# Patient Record
Sex: Female | Born: 1955 | Race: White | Hispanic: No | Marital: Married | Smoking: Never smoker
Health system: Southern US, Community
[De-identification: ages and names within clinical notes are randomized; demographics above are authoritative.]

## PROBLEM LIST (undated history)

## (undated) DIAGNOSIS — M8580 Other specified disorders of bone density and structure, unspecified site: Secondary | ICD-10-CM

## (undated) DIAGNOSIS — F4321 Adjustment disorder with depressed mood: Secondary | ICD-10-CM

## (undated) DIAGNOSIS — M810 Age-related osteoporosis without current pathological fracture: Secondary | ICD-10-CM

## (undated) DIAGNOSIS — N200 Calculus of kidney: Secondary | ICD-10-CM

## (undated) DIAGNOSIS — Z8679 Personal history of other diseases of the circulatory system: Secondary | ICD-10-CM

## (undated) HISTORY — DX: Other specified disorders of bone density and structure, unspecified site: M85.80

## (undated) HISTORY — DX: Personal history of other diseases of the circulatory system: Z86.79

## (undated) HISTORY — DX: Adjustment disorder with depressed mood: F43.21

## (undated) HISTORY — DX: Calculus of kidney: N20.0

## (undated) HISTORY — DX: Age-related osteoporosis without current pathological fracture: M81.0

---

## 1989-09-06 HISTORY — PX: TUBAL LIGATION: SHX77

## 1999-04-30 ENCOUNTER — Other Ambulatory Visit: Admission: RE | Admit: 1999-04-30 | Discharge: 1999-04-30 | Payer: Self-pay | Admitting: Obstetrics and Gynecology

## 2000-03-24 ENCOUNTER — Other Ambulatory Visit: Admission: RE | Admit: 2000-03-24 | Discharge: 2000-03-24 | Payer: Self-pay | Admitting: Obstetrics and Gynecology

## 2001-05-02 ENCOUNTER — Other Ambulatory Visit: Admission: RE | Admit: 2001-05-02 | Discharge: 2001-05-02 | Payer: Self-pay | Admitting: *Deleted

## 2002-05-03 ENCOUNTER — Other Ambulatory Visit: Admission: RE | Admit: 2002-05-03 | Discharge: 2002-05-03 | Payer: Self-pay | Admitting: Obstetrics and Gynecology

## 2003-05-07 ENCOUNTER — Other Ambulatory Visit: Admission: RE | Admit: 2003-05-07 | Discharge: 2003-05-07 | Payer: Self-pay | Admitting: Obstetrics and Gynecology

## 2003-05-14 ENCOUNTER — Encounter: Admission: RE | Admit: 2003-05-14 | Discharge: 2003-05-14 | Payer: Self-pay | Admitting: Obstetrics and Gynecology

## 2003-05-14 ENCOUNTER — Encounter: Payer: Self-pay | Admitting: Obstetrics and Gynecology

## 2004-05-07 ENCOUNTER — Other Ambulatory Visit: Admission: RE | Admit: 2004-05-07 | Discharge: 2004-05-07 | Payer: Self-pay | Admitting: Obstetrics and Gynecology

## 2004-07-07 ENCOUNTER — Encounter: Admission: RE | Admit: 2004-07-07 | Discharge: 2004-07-07 | Payer: Self-pay | Admitting: Obstetrics and Gynecology

## 2005-05-13 ENCOUNTER — Other Ambulatory Visit: Admission: RE | Admit: 2005-05-13 | Discharge: 2005-05-13 | Payer: Self-pay | Admitting: Obstetrics and Gynecology

## 2006-05-19 ENCOUNTER — Other Ambulatory Visit: Admission: RE | Admit: 2006-05-19 | Discharge: 2006-05-19 | Payer: Self-pay | Admitting: Obstetrics & Gynecology

## 2007-05-26 ENCOUNTER — Other Ambulatory Visit: Admission: RE | Admit: 2007-05-26 | Discharge: 2007-05-26 | Payer: Self-pay | Admitting: Obstetrics & Gynecology

## 2007-08-16 LAB — HM COLONOSCOPY: HM Colonoscopy: NORMAL

## 2008-05-31 ENCOUNTER — Other Ambulatory Visit: Admission: RE | Admit: 2008-05-31 | Discharge: 2008-05-31 | Payer: Self-pay | Admitting: Obstetrics and Gynecology

## 2010-07-03 ENCOUNTER — Encounter: Admission: RE | Admit: 2010-07-03 | Discharge: 2010-07-03 | Payer: Self-pay | Admitting: Obstetrics and Gynecology

## 2010-09-26 ENCOUNTER — Encounter: Payer: Self-pay | Admitting: Obstetrics and Gynecology

## 2013-07-11 ENCOUNTER — Encounter: Payer: Self-pay | Admitting: Obstetrics and Gynecology

## 2013-07-12 ENCOUNTER — Ambulatory Visit (INDEPENDENT_AMBULATORY_CARE_PROVIDER_SITE_OTHER): Payer: PRIVATE HEALTH INSURANCE | Admitting: Nurse Practitioner

## 2013-07-12 ENCOUNTER — Encounter: Payer: Self-pay | Admitting: Nurse Practitioner

## 2013-07-12 VITALS — BP 140/78 | HR 60 | Resp 16 | Ht 63.5 in | Wt 119.0 lb

## 2013-07-12 DIAGNOSIS — Z01419 Encounter for gynecological examination (general) (routine) without abnormal findings: Secondary | ICD-10-CM

## 2013-07-12 DIAGNOSIS — Z0000 Encounter for general adult medical examination without abnormal findings: Secondary | ICD-10-CM

## 2013-07-12 DIAGNOSIS — Z780 Asymptomatic menopausal state: Secondary | ICD-10-CM

## 2013-07-12 LAB — COMPREHENSIVE METABOLIC PANEL
ALT: 23 U/L (ref 0–35)
AST: 20 U/L (ref 0–37)
Albumin: 4.1 g/dL (ref 3.5–5.2)
Calcium: 9.5 mg/dL (ref 8.4–10.5)
Chloride: 100 mEq/L (ref 96–112)
Creat: 0.66 mg/dL (ref 0.50–1.10)
Glucose, Bld: 82 mg/dL (ref 70–99)
Sodium: 137 mEq/L (ref 135–145)
Total Bilirubin: 0.7 mg/dL (ref 0.3–1.2)
Total Protein: 6.6 g/dL (ref 6.0–8.3)

## 2013-07-12 LAB — POCT URINALYSIS DIPSTICK
Glucose, UA: NEGATIVE
Nitrite, UA: NEGATIVE
Protein, UA: NEGATIVE
Urobilinogen, UA: NEGATIVE
pH, UA: 5

## 2013-07-12 LAB — LIPID PANEL
Cholesterol: 221 mg/dL — ABNORMAL HIGH (ref 0–200)
Total CHOL/HDL Ratio: 2.9 Ratio

## 2013-07-12 LAB — TSH: TSH: 1.291 u[IU]/mL (ref 0.350–4.500)

## 2013-07-12 LAB — VITAMIN D 25 HYDROXY (VIT D DEFICIENCY, FRACTURES): Vit D, 25-Hydroxy: 54 ng/mL (ref 30–89)

## 2013-07-12 LAB — HEMOGLOBIN, FINGERSTICK: Hemoglobin, fingerstick: 14.2 g/dL (ref 12.0–16.0)

## 2013-07-12 MED ORDER — FLUOXETINE HCL 20 MG PO CAPS: 20.0000 mg | ORAL_CAPSULE | Freq: Every day | ORAL | Status: DC

## 2013-07-12 MED ORDER — FLUOXETINE HCL 10 MG PO CAPS: 10.0000 mg | ORAL_CAPSULE | Freq: Every day | ORAL | Status: DC

## 2013-07-12 NOTE — Progress Notes (Deleted)
[place], returned on 07/01/13

## 2013-07-12 NOTE — Patient Instructions (Signed)

## 2013-07-12 NOTE — Progress Notes (Signed)
Patient ID: April Spears, female   DOB: Nov 20, 1955, 57 y.o.   MRN: [id number] 57 y.o. G86P0101 Married Caucasian Fe here for annual exam.  No new health problems.  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  Since then she has lost 10 lbs.  She is doing well on this dose of Prozac and wants to stay at 30 mg.  Work is still stressful.  No LMP recorded. Patient is postmenopausal.          Sexually active: yes  The current method of family planning is post menopausal status.    Exercising: yes  Home exercise routine includes walking .5 hrs per day. Smoker:  no  Health Maintenance: Pap: 07/07/12, WNL, neg HR HPV MMG: 07/03/10, Bi-Rads 1: negative Colonoscopy: 08/16/07, normal, repeat in 10 years BMD: 07/2004 -0.9/-0.9 TDaP: 07/07/12 Labs: HB:  Urine: negative   reports that she has never smoked. She has never used smokeless tobacco. She reports that she drinks alcohol. She reports that she does not use illicit drugs.  Past Medical History  Diagnosis Date  . Renal calculi   . Situational depression     Past Surgical History  Procedure Laterality Date  . Tubal ligation Bilateral 1991    Current Outpatient Prescriptions  Medication Sig Dispense Refill  . FLUoxetine (PROZAC) 10 MG capsule Take 1 capsule (10 mg total) by mouth daily. Take in addition to 20 mg tab.  90 capsule  3  . FLUoxetine (PROZAC) 20 MG capsule Take 1 capsule (20 mg total) by mouth daily. Take in addition to 10 mg tab  90 capsule  3  . ValACYclovir HCl (VALTREX PO) Take by mouth as needed.       No current facility-administered medications for this visit.    Family History  Problem Relation Age of Onset  . Bone cancer Mother   . Thyroid disease Mother   . Diabetes Father   . Hyperlipidemia Father   . Hypertension Father   . Diabetes Sister     ROS:  Pertinent items are noted in HPI.  Otherwise, a comprehensive ROS was negative.  Exam:   BP 140/78  Pulse 60  Resp 16  Ht 5' 3.5"  (1.613 m)  Wt 119 lb (53.978 kg)  BMI 20.75 kg/m2 Height: 5' 3.5" (161.3 cm)  Ht Readings from Last 3 Encounters:  07/12/13 5' 3.5" (1.613 m)   Wt: 07/07/12+ 128.6  Weight loss with exercise and diet.   General appearance: alert, cooperative and appears stated age Head: Normocephalic, without obvious abnormality, atraumatic Neck: no adenopathy, supple, symmetrical, trachea midline and thyroid normal to inspection and palpation Lungs: clear to auscultation bilaterally Breasts: normal appearance, no masses or tenderness Heart: regular rate and rhythm Abdomen: soft, non-tender; no masses,  no organomegaly Extremities: extremities normal, atraumatic, no cyanosis or edema Skin: Skin color, texture, turgor normal. No rashes or lesions Lymph nodes: Cervical, supraclavicular, and axillary nodes normal. No abnormal inguinal nodes palpated Neurologic: Grossly normal   Pelvic: External genitalia:  no lesions              Urethra:  normal appearing urethra with no masses, tenderness or lesions              Bartholin's and Skene's: normal                 Vagina: normal appearing vagina with normal color and discharge, no lesions  Cervix: anteverted              Pap taken: no Bimanual Exam:  Uterus:  normal size, contour, position, consistency, mobility, non-tender              Adnexa: no mass, fullness, tenderness               Rectovaginal: Confirms               Anus:  normal sphincter tone, no lesions  A:  Well Woman with normal exam  Postmenopausal no HRT  Situational Depression.    P:   Pap smear as per guidelines   Mammogram due now and will schedule  [facility]  Refill on Prozac 30 mg  - 20 mg and 10 mg for a year.  Will follow with labs  IFOB today  Counseled on breast self exam, adequate intake of calcium and vitamin D, diet and exercise return annually or prn  An After Visit Summary was printed and given to the patient.

## 2013-07-18 NOTE — Progress Notes (Signed)
Encounter reviewed by Dr. Lourie Retz Silva.  

## 2013-07-23 ENCOUNTER — Telehealth: Payer: Self-pay | Admitting: *Deleted

## 2013-07-23 NOTE — Telephone Encounter (Signed)
I have attempted to contact this patient by phone with the following results: left message to return my call on answering machine (home).  

## 2013-07-23 NOTE — Telephone Encounter (Signed)
Message copied by Luisa Dago on Mon Jul 23, 2013  4:08 PM ------      Message from: Ria Comment R      Created: Fri Jul 13, 2013  5:11 PM       Let patient know about test results - her cholesterol is a little up. ------

## 2013-07-30 NOTE — Telephone Encounter (Signed)
Patient returning Stephanie's call.

## 2013-07-30 NOTE — Telephone Encounter (Signed)
Pt.notified

## 2014-07-08 ENCOUNTER — Encounter: Payer: Self-pay | Admitting: Nurse Practitioner

## 2014-07-15 ENCOUNTER — Ambulatory Visit (INDEPENDENT_AMBULATORY_CARE_PROVIDER_SITE_OTHER): Payer: PRIVATE HEALTH INSURANCE | Admitting: Nurse Practitioner

## 2014-07-15 ENCOUNTER — Other Ambulatory Visit: Payer: Self-pay | Admitting: Nurse Practitioner

## 2014-07-15 ENCOUNTER — Encounter: Payer: Self-pay | Admitting: Nurse Practitioner

## 2014-07-15 VITALS — BP 134/86 | HR 92 | Ht 63.75 in | Wt 120.0 lb

## 2014-07-15 DIAGNOSIS — Z0000 Encounter for general adult medical examination without abnormal findings: Secondary | ICD-10-CM

## 2014-07-15 DIAGNOSIS — Z1231 Encounter for screening mammogram for malignant neoplasm of breast: Secondary | ICD-10-CM

## 2014-07-15 DIAGNOSIS — Z833 Family history of diabetes mellitus: Secondary | ICD-10-CM

## 2014-07-15 DIAGNOSIS — I10 Essential (primary) hypertension: Secondary | ICD-10-CM

## 2014-07-15 DIAGNOSIS — Z01419 Encounter for gynecological examination (general) (routine) without abnormal findings: Secondary | ICD-10-CM

## 2014-07-15 LAB — TSH: TSH: 1.981 u[IU]/mL (ref 0.350–4.500)

## 2014-07-15 LAB — LIPID PANEL
CHOL/HDL RATIO: 2.7 ratio
CHOLESTEROL: 235 mg/dL — AB (ref 0–200)
HDL: 88 mg/dL (ref >39)
LDL Cholesterol: 117 mg/dL — ABNORMAL HIGH (ref 0–99)
Triglycerides: 148 mg/dL (ref <150)
VLDL: 30 mg/dL (ref 0–40)

## 2014-07-15 LAB — COMPREHENSIVE METABOLIC PANEL
ALT: 19 U/L (ref 0–35)
AST: 25 U/L (ref 0–37)
Albumin: 4.2 g/dL (ref 3.5–5.2)
Alkaline Phosphatase: 53 U/L (ref 39–117)
BUN: 10 mg/dL (ref 6–23)
CO2: 27 mEq/L (ref 19–32)
CREATININE: 0.7 mg/dL (ref 0.50–1.10)
Calcium: 9.1 mg/dL (ref 8.4–10.5)
Chloride: 102 mEq/L (ref 96–112)
Glucose, Bld: 129 mg/dL — ABNORMAL HIGH (ref 70–99)
Potassium: 4.4 mEq/L (ref 3.5–5.3)
Sodium: 139 mEq/L (ref 135–145)
Total Bilirubin: 0.6 mg/dL (ref 0.2–1.2)
Total Protein: 6.3 g/dL (ref 6.0–8.3)

## 2014-07-15 LAB — HEMOGLOBIN, FINGERSTICK: HEMOGLOBIN, FINGERSTICK: 14.3 g/dL (ref 12.0–16.0)

## 2014-07-15 MED ORDER — VALACYCLOVIR HCL 1 G PO TABS: 1000.0000 mg | ORAL_TABLET | Freq: Two times a day (BID) | ORAL | Status: DC

## 2014-07-15 MED ORDER — FLUOXETINE HCL 20 MG PO CAPS: 20.0000 mg | ORAL_CAPSULE | Freq: Every day | ORAL | Status: DC

## 2014-07-15 MED ORDER — FLUOXETINE HCL 10 MG PO CAPS: 10.0000 mg | ORAL_CAPSULE | Freq: Every day | ORAL | Status: DC

## 2014-07-15 NOTE — Progress Notes (Signed)
58 y.o. 731P0101 Married Caucasian Fe here for annual exam.  No new health problems.  Patient's last menstrual period was 05/09/2006 (exact date).          Sexually active: yes  The current method of family planning is post menopausal status.  Exercising: yes Home exercise routine includes walking 30 minutes every day. Smoker: no  Health Maintenance: Pap: 07/07/12, WNL, neg HR HPV MMG: 07/03/10, Bi-Rads 1: negative Colonoscopy: 08/16/07, normal, repeat in 10 years BMD: 07/2004 -0.9/-0.9 TDaP: 07/07/12 Flu Shot:  06/25/14 Labs:  HB:  14.3  Urine:  Unable to void   reports that she has never smoked. She has never used smokeless tobacco. She reports that she drinks alcohol. She reports that she does not use illicit drugs.  Past Medical History  Diagnosis Date  . Renal calculi   . Situational depression     Past Surgical History  Procedure Laterality Date  . Tubal ligation Bilateral 1991    Current Outpatient Prescriptions  Medication Sig Dispense Refill  . FLUoxetine (PROZAC) 10 MG capsule Take 1 capsule (10 mg total) by mouth daily. Take in addition to 20 mg tab. 90 capsule 3  . FLUoxetine (PROZAC) 20 MG capsule Take 1 capsule (20 mg total) by mouth daily. Take in addition to 10 mg tab 90 capsule 3  . ValACYclovir HCl (VALTREX PO) Take by mouth as needed.     No current facility-administered medications for this visit.    Family History  Problem Relation Age of Onset  . Bone cancer Mother   . Thyroid disease Mother   . Diabetes Father   . Hyperlipidemia Father   . Hypertension Father   . Diabetes Sister     ROS:  Pertinent items are noted in HPI.  Otherwise, a comprehensive ROS was negative.  Exam:   BP 134/86 mmHg  Pulse 92  Ht 5' 3.75" (1.619 m)  Wt 120 lb (54.432 kg)  BMI 20.77 kg/m2  LMP 05/09/2006 (Exact Date) Height: 5' 3.75" (161.9 cm)  Ht Readings from Last 3 Encounters:  07/15/14 5' 3.75" (1.619 m)  07/12/13 5' 3.5" (1.613 m)    General  appearance: alert, cooperative and appears stated age Head: Normocephalic, without obvious abnormality, atraumatic Neck: no adenopathy, supple, symmetrical, trachea midline and thyroid normal to inspection and palpation Lungs: clear to auscultation bilaterally Breasts: normal appearance, no masses or tenderness Heart: regular rate and rhythm Abdomen: soft, non-tender; no masses,  no organomegaly Extremities: extremities normal, atraumatic, no cyanosis or edema Skin: Skin color, texture, turgor normal. No rashes or lesions Lymph nodes: Cervical, supraclavicular, and axillary nodes normal. No abnormal inguinal nodes palpated Neurologic: Grossly normal   Pelvic: External genitalia:  no lesions              Urethra:  normal appearing urethra with no masses, tenderness or lesions              Bartholin's and Skene's: normal                 Vagina: normal appearing vagina with normal color and discharge, no lesions              Cervix: anteverted              Pap taken: No. Bimanual Exam:  Uterus:  normal size, contour, position, consistency, mobility, non-tender              Adnexa: no mass, fullness, tenderness  Rectovaginal: Confirms               Anus:  normal sphincter tone, no lesions  A:  Well Woman with normal exam  Postmenopausal no HRT Situational Depression.  History of HSV I  P:   Reviewed health and wellness pertinent to exam  Pap smear not taken today  Mammogram is past due and will schedule  Refill Prozac 30 mg daily ( 20 mg and 10 mg daily) for a year  Refill Valtrex for a year  Will follow with labs  Counseled on breast self exam, mammography screening, adequate intake of calcium and vitamin D, diet and exercise return annually or prn  An After Visit Summary was printed and given to the patient.

## 2014-07-15 NOTE — Patient Instructions (Signed)

## 2014-07-16 LAB — VITAMIN D 25 HYDROXY (VIT D DEFICIENCY, FRACTURES): Vit D, 25-Hydroxy: 57 ng/mL (ref 30–89)

## 2014-07-18 LAB — HEMOGLOBIN A1C
HEMOGLOBIN A1C: 5.1 % (ref <5.7)
Mean Plasma Glucose: 100 mg/dL (ref <117)

## 2014-07-18 NOTE — Progress Notes (Signed)
Encounter reviewed by Dr. Brook Silva.  

## 2014-07-23 ENCOUNTER — Ambulatory Visit
Admission: RE | Admit: 2014-07-23 | Discharge: 2014-07-23 | Disposition: A | Discharge status: Home / Self Care | Payer: PRIVATE HEALTH INSURANCE | Source: Ambulatory Visit | Attending: Nurse Practitioner | Admitting: Nurse Practitioner

## 2014-07-23 DIAGNOSIS — Z1231 Encounter for screening mammogram for malignant neoplasm of breast: Secondary | ICD-10-CM

## 2014-07-24 ENCOUNTER — Telehealth: Payer: Self-pay | Admitting: *Deleted

## 2014-07-24 NOTE — Telephone Encounter (Signed)
I have attempted to contact this patient by phone with the following results: left message to return call to April Spears at [phone] on answering machine (mobile per [facility]).  No personal information given.  [phone] (Home/Mobile)  Notes Recorded by April FranklinPatricia Rolen-Grubb, FNP Let patient know that Lipid panel still shows elevated total cholesterol and LDL. Both of these are higher than last year. Must work on low cholesterol diet. The thyroid, Vit D, kidney, and liver test are normal. The glucose was elevated and will get HGB AIC and let her know those results later.

## 2014-07-24 NOTE — Telephone Encounter (Signed)
-----   Message from Lauro FranklinPatricia Rolen-Grubb, FNP sent at 07/16/2014  8:24 AM EST ----- Please add HGB AIC

## 2014-07-26 NOTE — Telephone Encounter (Signed)
Pt notified in result note.  Closing encounter. 

## 2014-12-19 ENCOUNTER — Telehealth: Payer: Self-pay | Admitting: Nurse Practitioner

## 2014-12-19 NOTE — Telephone Encounter (Signed)
Left patient a message to call back to reschedule a future appointment that was cancelled by the provider. °

## 2015-07-17 ENCOUNTER — Ambulatory Visit: Payer: PRIVATE HEALTH INSURANCE | Admitting: Nurse Practitioner

## 2015-07-23 ENCOUNTER — Ambulatory Visit (INDEPENDENT_AMBULATORY_CARE_PROVIDER_SITE_OTHER): Payer: PRIVATE HEALTH INSURANCE | Admitting: Nurse Practitioner

## 2015-07-23 ENCOUNTER — Encounter: Payer: Self-pay | Admitting: Nurse Practitioner

## 2015-07-23 VITALS — BP 136/88 | HR 72 | Ht 63.5 in | Wt 122.0 lb

## 2015-07-23 DIAGNOSIS — Z87448 Personal history of other diseases of urinary system: Secondary | ICD-10-CM | POA: Diagnosis not present

## 2015-07-23 DIAGNOSIS — Z0000 Encounter for general adult medical examination without abnormal findings: Secondary | ICD-10-CM | POA: Diagnosis not present

## 2015-07-23 DIAGNOSIS — Z01419 Encounter for gynecological examination (general) (routine) without abnormal findings: Secondary | ICD-10-CM

## 2015-07-23 DIAGNOSIS — Z1211 Encounter for screening for malignant neoplasm of colon: Secondary | ICD-10-CM | POA: Diagnosis not present

## 2015-07-23 DIAGNOSIS — Z87898 Personal history of other specified conditions: Secondary | ICD-10-CM

## 2015-07-23 LAB — POCT URINALYSIS DIPSTICK
Bilirubin, UA: NEGATIVE
Blood, UA: NEGATIVE
GLUCOSE UA: NEGATIVE
Ketones, UA: NEGATIVE
Nitrite, UA: NEGATIVE
Protein, UA: NEGATIVE
UROBILINOGEN UA: NEGATIVE
pH, UA: 7

## 2015-07-23 LAB — LIPID PANEL
CHOLESTEROL: 200 mg/dL (ref 125–200)
HDL: 80 mg/dL (ref >=46)
LDL Cholesterol: 109 mg/dL (ref <130)
Total CHOL/HDL Ratio: 2.5 Ratio (ref <=5.0)
Triglycerides: 56 mg/dL (ref <150)
VLDL: 11 mg/dL (ref <30)

## 2015-07-23 LAB — COMPREHENSIVE METABOLIC PANEL
ALBUMIN: 4 g/dL (ref 3.6–5.1)
ALT: 14 U/L (ref 6–29)
AST: 17 U/L (ref 10–35)
Alkaline Phosphatase: 54 U/L (ref 33–130)
BUN: 9 mg/dL (ref 7–25)
CHLORIDE: 101 mmol/L (ref 98–110)
CO2: 28 mmol/L (ref 20–31)
Calcium: 9.2 mg/dL (ref 8.6–10.4)
Creat: 0.56 mg/dL (ref 0.50–1.05)
Glucose, Bld: 81 mg/dL (ref 65–99)
POTASSIUM: 4.6 mmol/L (ref 3.5–5.3)
Sodium: 137 mmol/L (ref 135–146)
TOTAL PROTEIN: 6.3 g/dL (ref 6.1–8.1)
Total Bilirubin: 0.5 mg/dL (ref 0.2–1.2)

## 2015-07-23 LAB — CBC WITH DIFFERENTIAL/PLATELET
Basophils Absolute: 0 10*3/uL (ref 0.0–0.1)
Basophils Relative: 1 % (ref 0–1)
Eosinophils Absolute: 0.1 10*3/uL (ref 0.0–0.7)
Eosinophils Relative: 2 % (ref 0–5)
HCT: 42.3 % (ref 36.0–46.0)
Hemoglobin: 13.8 g/dL (ref 12.0–15.0)
Lymphocytes Relative: 22 % (ref 12–46)
Lymphs Abs: 1 10*3/uL (ref 0.7–4.0)
MCH: 30.3 pg (ref 26.0–34.0)
MCHC: 32.6 g/dL (ref 30.0–36.0)
MCV: 92.8 fL (ref 78.0–100.0)
MPV: 10.6 fL (ref 8.6–12.4)
Monocytes Absolute: 0.4 10*3/uL (ref 0.1–1.0)
Monocytes Relative: 10 % (ref 3–12)
NEUTROS ABS: 2.9 10*3/uL (ref 1.7–7.7)
Neutrophils Relative %: 65 % (ref 43–77)
Platelets: 255 10*3/uL (ref 150–400)
RBC: 4.56 MIL/uL (ref 3.87–5.11)
RDW: 13 % (ref 11.5–15.5)
WBC: 4.4 10*3/uL (ref 4.0–10.5)

## 2015-07-23 LAB — URINALYSIS, MICROSCOPIC ONLY
Bacteria, UA: NONE SEEN [HPF]
CRYSTALS: NONE SEEN [HPF]
Casts: NONE SEEN [LPF]
RBC / HPF: NONE SEEN RBC/HPF (ref <=2)
WBC UA: NONE SEEN WBC/HPF (ref <=5)
Yeast: NONE SEEN [HPF]

## 2015-07-23 LAB — TSH: TSH: 0.986 u[IU]/mL (ref 0.350–4.500)

## 2015-07-23 MED ORDER — FLUOXETINE HCL 20 MG PO CAPS: 20.0000 mg | ORAL_CAPSULE | Freq: Every day | ORAL | Status: DC

## 2015-07-23 MED ORDER — FLUOXETINE HCL 10 MG PO CAPS: 10.0000 mg | ORAL_CAPSULE | Freq: Every day | ORAL | Status: DC

## 2015-07-23 MED ORDER — VALACYCLOVIR HCL 1 G PO TABS: 1000.0000 mg | ORAL_TABLET | Freq: Two times a day (BID) | ORAL | Status: DC

## 2015-07-23 NOTE — Patient Instructions (Signed)

## 2015-07-23 NOTE — Progress Notes (Signed)
Patient ID: April Spears, female   DOB: [birth year], 59 y.o.   MRN: [id number] 59 y.o. G2P0101 Married  Caucasian Fe here for annual exam.  No new health diagnosis.  Some vaginal dryness.  Most recently urinary urgency.  Patient's last menstrual period was 05/09/2006 (exact date).          Sexually active: Yes.    The current method of family planning is tubal ligation and post menopausal status.    Exercising: Yes.    walking Smoker:  no  Health Maintenance: Pap: 07/07/12, Negative with neg HR HPV MMG: 07/23/14, Bi-Rads 1: negative Colonoscopy: 08/16/07, normal, repeat in 10 years BMD: 07/2004 -0.9/-0.9 TDaP: 07/07/12 Labs: labs done fasting  Urine: trace leuk's   reports that she has never smoked. She has never used smokeless tobacco. She reports that she drinks alcohol. She reports that she does not use illicit drugs.  Past Medical History  Diagnosis Date  . Renal calculi   . Situational depression     Past Surgical History  Procedure Laterality Date  . Tubal ligation Bilateral 1991    Current Outpatient Prescriptions  Medication Sig Dispense Refill  . FLUoxetine (PROZAC) 10 MG capsule Take 1 capsule (10 mg total) by mouth daily. Take in addition to 20 mg tab. 90 capsule 3  . FLUoxetine (PROZAC) 20 MG capsule Take 1 capsule (20 mg total) by mouth daily. Take in addition to 10 mg tab 90 capsule 3  . valACYclovir (VALTREX) 1000 MG tablet Take 1 tablet (1,000 mg total) by mouth 2 (two) times daily. 90 tablet 3   No current facility-administered medications for this visit.    Family History  Problem Relation Age of Onset  . Bone cancer Mother   . Thyroid disease Mother   . Diabetes Father   . Hyperlipidemia Father   . Hypertension Father   . Diabetes Sister     ROS:  Pertinent items are noted in HPI.  Otherwise, a comprehensive ROS was negative.  Exam:   BP 142/84 mmHg  Pulse 72  Ht 5' 3.5" (1.613 m)  Wt 122 lb (55.339 kg)  BMI 21.27 kg/m2  LMP 05/09/2006 (Exact  Date) Height: 5' 3.5" (161.3 cm) Ht Readings from Last 3 Encounters:  07/23/15 5' 3.5" (1.613 m)  07/15/14 5' 3.75" (1.619 m)  07/12/13 5' 3.5" (1.613 m)    General appearance: alert, cooperative and appears stated age Head: Normocephalic, without obvious abnormality, atraumatic Neck: no adenopathy, supple, symmetrical, trachea midline and thyroid normal to inspection and palpation Lungs: clear to auscultation bilaterally Breasts: normal appearance, no masses or tenderness Heart: regular rate and rhythm Abdomen: soft, non-tender; no masses,  no organomegaly Extremities: extremities normal, atraumatic, no cyanosis or edema Skin: Skin color, texture, turgor normal. No rashes or lesions Lymph nodes: Cervical, supraclavicular, and axillary nodes normal. No abnormal inguinal nodes palpated Neurologic: Grossly normal   Pelvic: External genitalia:  no lesions              Urethra:  normal appearing urethra with no masses, tenderness or lesions              Bartholin's and Skene's: normal                 Vagina: normal appearing vagina with normal color and discharge, no lesions              Cervix: anteverted              Pap taken: Yes.  Bimanual Exam:  Uterus:  normal size, contour, position, consistency, mobility, non-tender              Adnexa: no mass, fullness, tenderness               Rectovaginal: Confirms               Anus:  normal sphincter tone, no lesions  Chaperone present: yes  A:  Well Woman with normal exam  Postmenopausal no HRT Situational Depression. History of HSV I  P:   Reviewed health and wellness pertinent to exam  Pap smear as above  Mammogram is due now and will schedule  Refill on Prozac 10 & 20 mg for a total of 30 mg for a year  Refill on Valtrex to use prn for HSV I  Will follow with labs, IFOB  Counseled on breast self exam, mammography screening, adequate intake of calcium and vitamin D, diet and exercise return annually  or prn  An After Visit Summary was printed and given to the patient.

## 2015-07-24 LAB — URINE CULTURE
Colony Count: NO GROWTH
Organism ID, Bacteria: NO GROWTH

## 2015-07-24 LAB — HIV ANTIBODY (ROUTINE TESTING W REFLEX): HIV 1&2 Ab, 4th Generation: NONREACTIVE

## 2015-07-24 LAB — VITAMIN D 25 HYDROXY (VIT D DEFICIENCY, FRACTURES): VIT D 25 HYDROXY: 47 ng/mL (ref 30–100)

## 2015-07-24 LAB — HEPATITIS C ANTIBODY: HCV Ab: NEGATIVE

## 2015-07-27 NOTE — Progress Notes (Signed)
Encounter reviewed by Dr. Saysha Menta Amundson C. Silva.  

## 2015-07-28 LAB — IPS PAP TEST WITH HPV

## 2016-07-28 ENCOUNTER — Encounter: Payer: Self-pay | Admitting: Nurse Practitioner

## 2016-07-28 ENCOUNTER — Ambulatory Visit (INDEPENDENT_AMBULATORY_CARE_PROVIDER_SITE_OTHER): Payer: PRIVATE HEALTH INSURANCE | Admitting: Nurse Practitioner

## 2016-07-28 VITALS — BP 134/82 | HR 64 | Ht 63.25 in | Wt 120.0 lb

## 2016-07-28 DIAGNOSIS — Z0000 Encounter for general adult medical examination without abnormal findings: Secondary | ICD-10-CM

## 2016-07-28 DIAGNOSIS — Z01419 Encounter for gynecological examination (general) (routine) without abnormal findings: Secondary | ICD-10-CM | POA: Diagnosis not present

## 2016-07-28 DIAGNOSIS — R8290 Unspecified abnormal findings in urine: Secondary | ICD-10-CM | POA: Diagnosis not present

## 2016-07-28 DIAGNOSIS — Z1211 Encounter for screening for malignant neoplasm of colon: Secondary | ICD-10-CM | POA: Diagnosis not present

## 2016-07-28 LAB — CBC
HCT: 44 % (ref 35.0–45.0)
HEMOGLOBIN: 13.9 g/dL (ref 11.7–15.5)
MCH: 30.3 pg (ref 27.0–33.0)
MCHC: 31.6 g/dL — ABNORMAL LOW (ref 32.0–36.0)
MCV: 96.1 fL (ref 80.0–100.0)
MPV: 10.2 fL (ref 7.5–12.5)
PLATELETS: 204 10*3/uL (ref 140–400)
RBC: 4.58 MIL/uL (ref 3.80–5.10)
RDW: 13 % (ref 11.0–15.0)
WBC: 3.5 10*3/uL — AB (ref 3.8–10.8)

## 2016-07-28 LAB — POCT URINALYSIS DIPSTICK
Bilirubin, UA: NEGATIVE
Blood, UA: NEGATIVE
Glucose, UA: NEGATIVE
KETONES UA: NEGATIVE
Nitrite, UA: NEGATIVE
PROTEIN UA: NEGATIVE
Urobilinogen, UA: NEGATIVE
pH, UA: 7

## 2016-07-28 LAB — TSH: TSH: 1.21 m[IU]/L

## 2016-07-28 MED ORDER — FLUOXETINE HCL 10 MG PO CAPS: 10.0000 mg | ORAL_CAPSULE | Freq: Every day | ORAL | 4 refills | Status: DC

## 2016-07-28 MED ORDER — VALACYCLOVIR HCL 1 G PO TABS: 1000.0000 mg | ORAL_TABLET | Freq: Two times a day (BID) | ORAL | 3 refills | Status: DC

## 2016-07-28 MED ORDER — FLUOXETINE HCL 20 MG PO CAPS: 20.0000 mg | ORAL_CAPSULE | Freq: Every day | ORAL | 4 refills | Status: DC

## 2016-07-28 NOTE — Patient Instructions (Signed)

## 2016-07-28 NOTE — Progress Notes (Signed)
Patient ID: Baird LyonsJoy D Fiebig, female   DOB: 08-13-1956, 60 y.o.   MRN: [id number]  60 y.o. 171P0101 Married  Caucasian Fe here for annual exam.  No new diagnosis this year.  Doing well with anxiety on current dose of Prozac.  She was treated for UTI in October and now no symptoms.  Patient's last menstrual period was 05/09/2006 (exact date).          Sexually active: Yes.    The current method of family planning is tubal ligation.    Exercising: Yes.    walking Smoker:  no  Health Maintenance: Pap: 07/23/15, Negative with neg HR HPV MMG: 07/23/14, Bi-Rads 1: negative Colonoscopy: 08/16/07, normal, repeat in 10 years, IFOB is given BMD: 07/09/04 T Score: -0.9 Spine / -0.9 Left Femur Neck TDaP: 07/07/12 Shingles: will check with pharmacy Hep C and HIV: 07/23/15 Labs: HB: 13.6  Urine: 1+ WBC   reports that she has never smoked. She has never used smokeless tobacco. She reports that she drinks alcohol. She reports that she does not use drugs.  Past Medical History:  Diagnosis Date  . Renal calculi   . Situational depression     Past Surgical History:  Procedure Laterality Date  . TUBAL LIGATION Bilateral 1991    Current Outpatient Prescriptions  Medication Sig Dispense Refill  . FLUoxetine (PROZAC) 10 MG capsule Take 1 capsule (10 mg total) by mouth daily. Take in addition to 20 mg tab. 90 capsule 4  . FLUoxetine (PROZAC) 20 MG capsule Take 1 capsule (20 mg total) by mouth daily. Take in addition to 10 mg tab 90 capsule 4  . valACYclovir (VALTREX) 1000 MG tablet Take 1 tablet (1,000 mg total) by mouth 2 (two) times daily. 90 tablet 3   No current facility-administered medications for this visit.     Family History  Problem Relation Age of Onset  . Bone cancer Mother   . Thyroid disease Mother   . Diabetes Father   . Hyperlipidemia Father   . Hypertension Father   . Diabetes Sister     ROS:  Pertinent items are noted in HPI.  Otherwise, a comprehensive ROS was negative.  Exam:    LMP 05/09/2006 (Exact Date)    Ht Readings from Last 3 Encounters:  07/23/15 5' 3.5" (1.613 m)  07/15/14 5' 3.75" (1.619 m)  07/12/13 5' 3.5" (1.613 m)    General appearance: alert, cooperative and appears stated age Head: Normocephalic, without obvious abnormality, atraumatic Neck: no adenopathy, supple, symmetrical, trachea midline and thyroid normal to inspection and palpation Lungs: clear to auscultation bilaterally Breasts: normal appearance, no masses or tenderness Heart: regular rate and rhythm Abdomen: soft, non-tender; no masses,  no organomegaly Extremities: extremities normal, atraumatic, no cyanosis or edema Skin: Skin color, texture, turgor normal. No rashes or lesions Lymph nodes: Cervical, supraclavicular, and axillary nodes normal. No abnormal inguinal nodes palpated Neurologic: Grossly normal   Pelvic: External genitalia:  no lesions              Urethra:  normal appearing urethra with no masses, tenderness or lesions              Bartholin's and Skene's: normal                 Vagina: normal appearing vagina with normal color and discharge, no lesions              Cervix: anteverted  Pap taken: No. Bimanual Exam:  Uterus:  normal size, contour, position, consistency, mobility, non-tender              Adnexa: no mass, fullness, tenderness               Rectovaginal: Confirms               Anus:  normal sphincter tone, no lesions  Chaperone present: yes  A:  Well Woman with normal exam   Postmenopausal no HRT Situational Depression. History of HSV I   P:   Reviewed health and wellness pertinent to exam  Pap smear as above  Mammogram is due now and will schedule  IFOB is given  Counseled on breast self exam, mammography screening, adequate intake of calcium and vitamin D, diet and exercise, Kegel's exercises return annually or prn  An After Visit Summary was printed and given to the patient.

## 2016-07-29 LAB — COMPREHENSIVE METABOLIC PANEL
ALBUMIN: 4.1 g/dL (ref 3.6–5.1)
ALT: 16 U/L (ref 6–29)
AST: 21 U/L (ref 10–35)
Alkaline Phosphatase: 48 U/L (ref 33–130)
BUN: 11 mg/dL (ref 7–25)
CALCIUM: 9 mg/dL (ref 8.6–10.4)
CO2: 27 mmol/L (ref 20–31)
Chloride: 103 mmol/L (ref 98–110)
Creat: 0.66 mg/dL (ref 0.50–0.99)
Glucose, Bld: 80 mg/dL (ref 65–99)
POTASSIUM: 4.8 mmol/L (ref 3.5–5.3)
Sodium: 140 mmol/L (ref 135–146)
Total Bilirubin: 0.5 mg/dL (ref 0.2–1.2)
Total Protein: 6.5 g/dL (ref 6.1–8.1)

## 2016-07-29 LAB — VITAMIN D 25 HYDROXY (VIT D DEFICIENCY, FRACTURES): Vit D, 25-Hydroxy: 47 ng/mL (ref 30–100)

## 2016-07-29 LAB — LIPID PANEL
Cholesterol: 220 mg/dL — ABNORMAL HIGH (ref <200)
HDL: 92 mg/dL (ref >50)
LDL Cholesterol: 112 mg/dL — ABNORMAL HIGH (ref <100)
TRIGLYCERIDES: 80 mg/dL (ref <150)
Total CHOL/HDL Ratio: 2.4 Ratio (ref <5.0)
VLDL: 16 mg/dL (ref <30)

## 2016-07-29 NOTE — Progress Notes (Signed)
Reviewed personally.  M. Suzanne Whitley Patchen, MD.  

## 2016-07-30 LAB — URINE CULTURE: ORGANISM ID, BACTERIA: NO GROWTH

## 2016-08-02 LAB — HEMOGLOBIN, FINGERSTICK: HEMOGLOBIN, FINGERSTICK: 13.6 g/dL (ref 12.0–16.0)

## 2016-11-09 IMAGING — MG MM SCREEN MAMMOGRAM BILATERAL
4 series · 4 of 4 positions shown · non-contrast
Comparison: Previous exam(s).

CLINICAL DATA: Screening.

EXAM:
DIGITAL SCREENING BILATERAL MAMMOGRAM WITH CAD

[R CC]
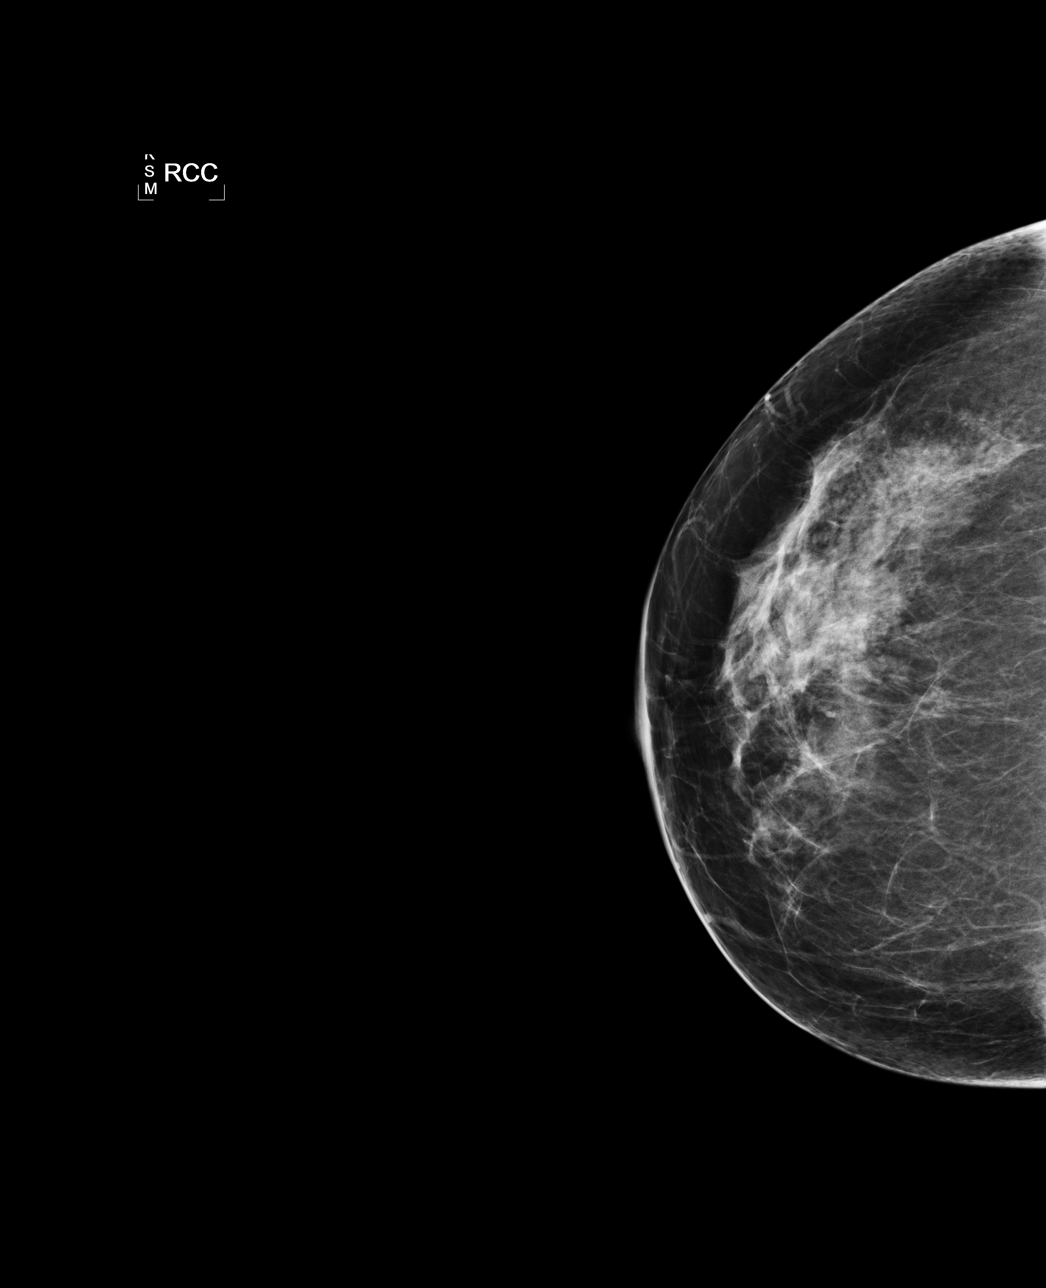

[L CC]
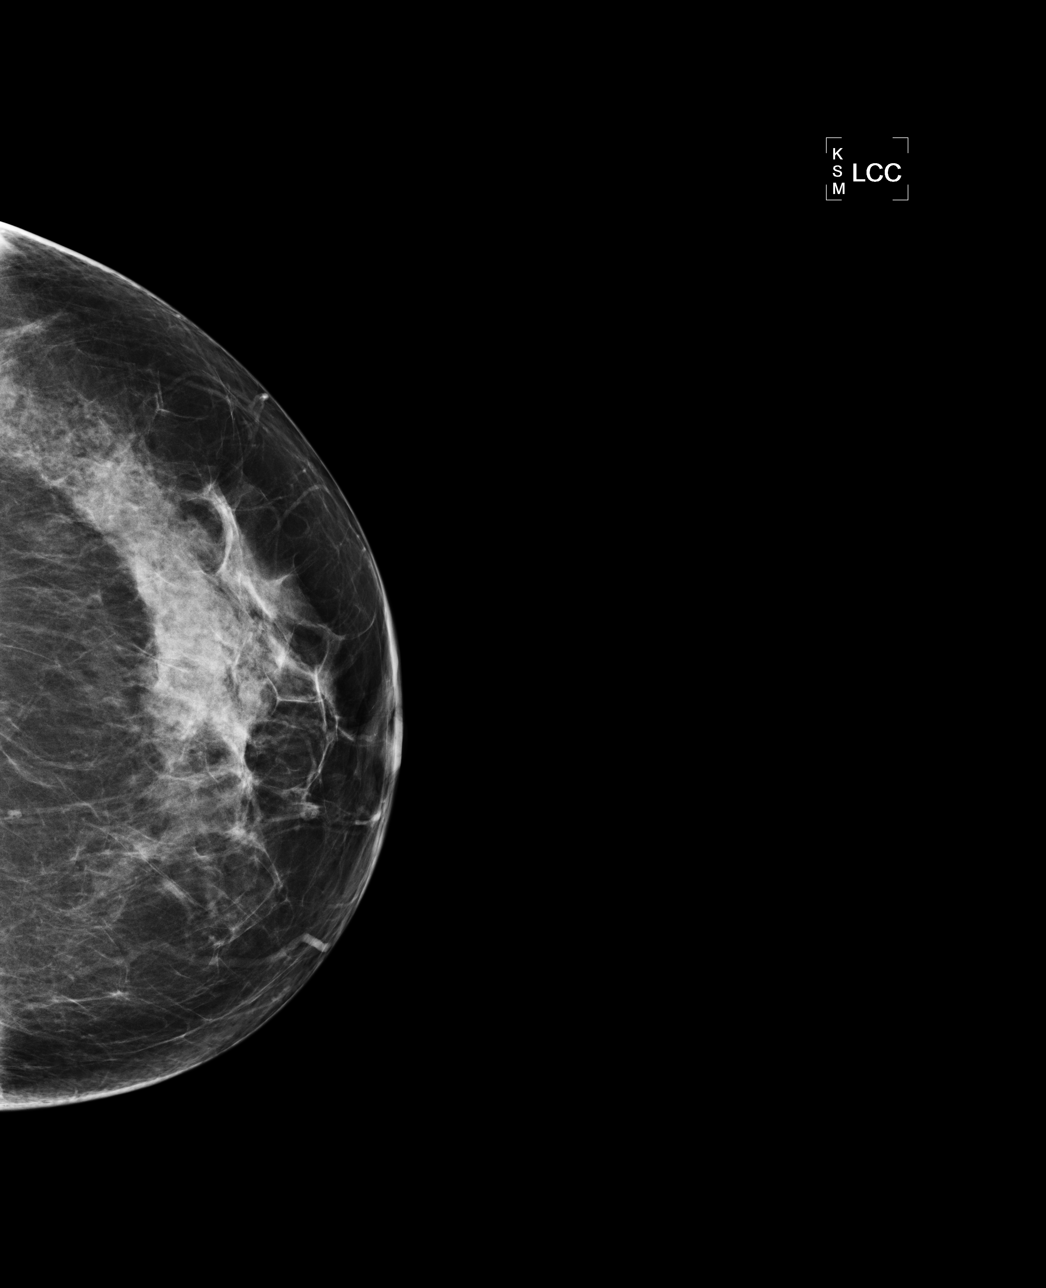

[L MLO]
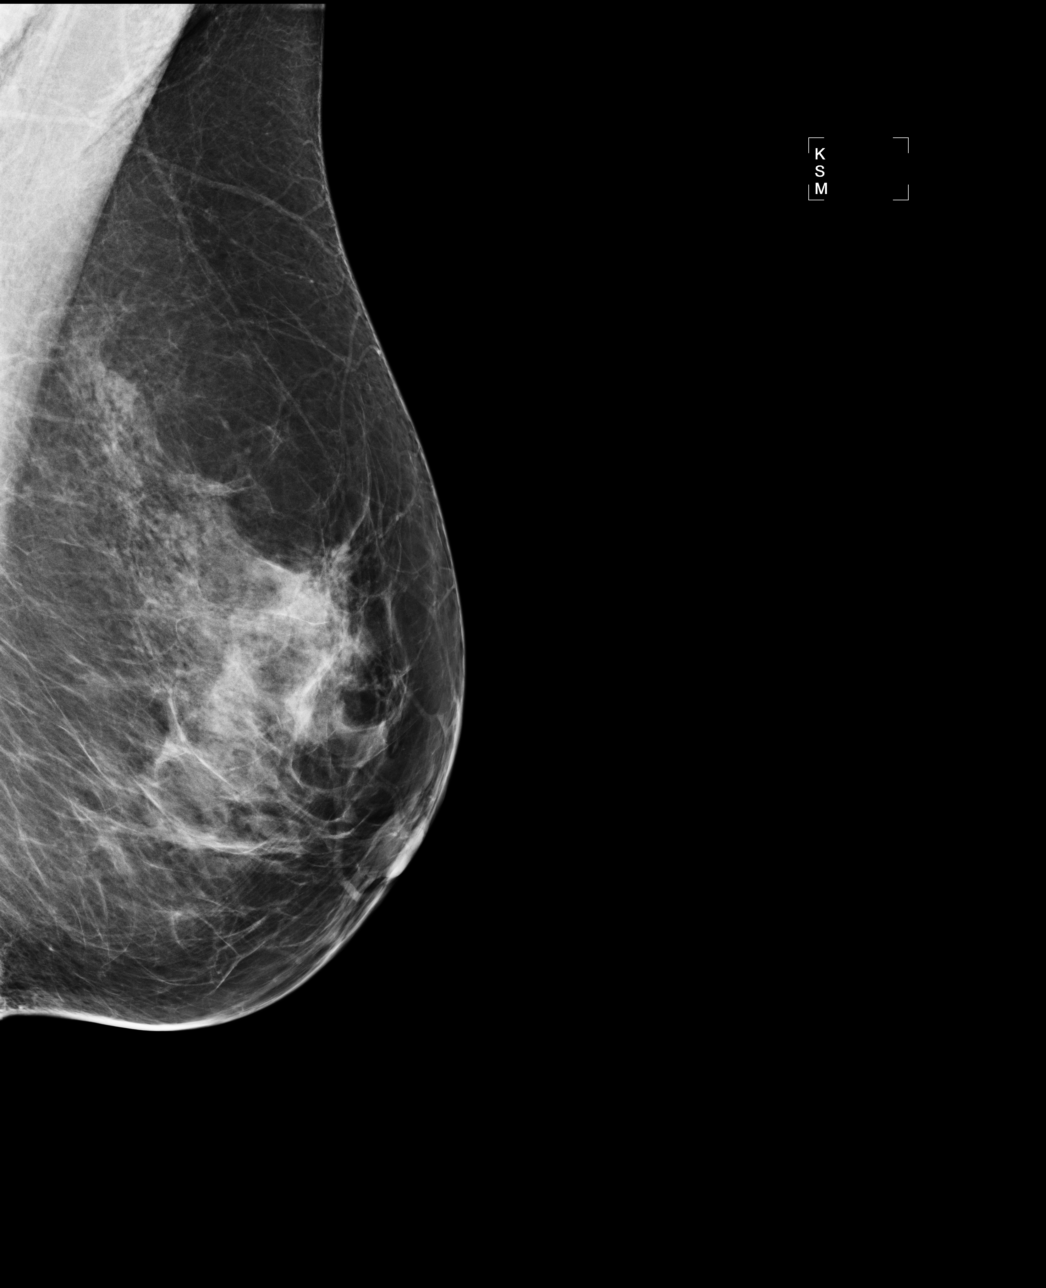

[R MLO]
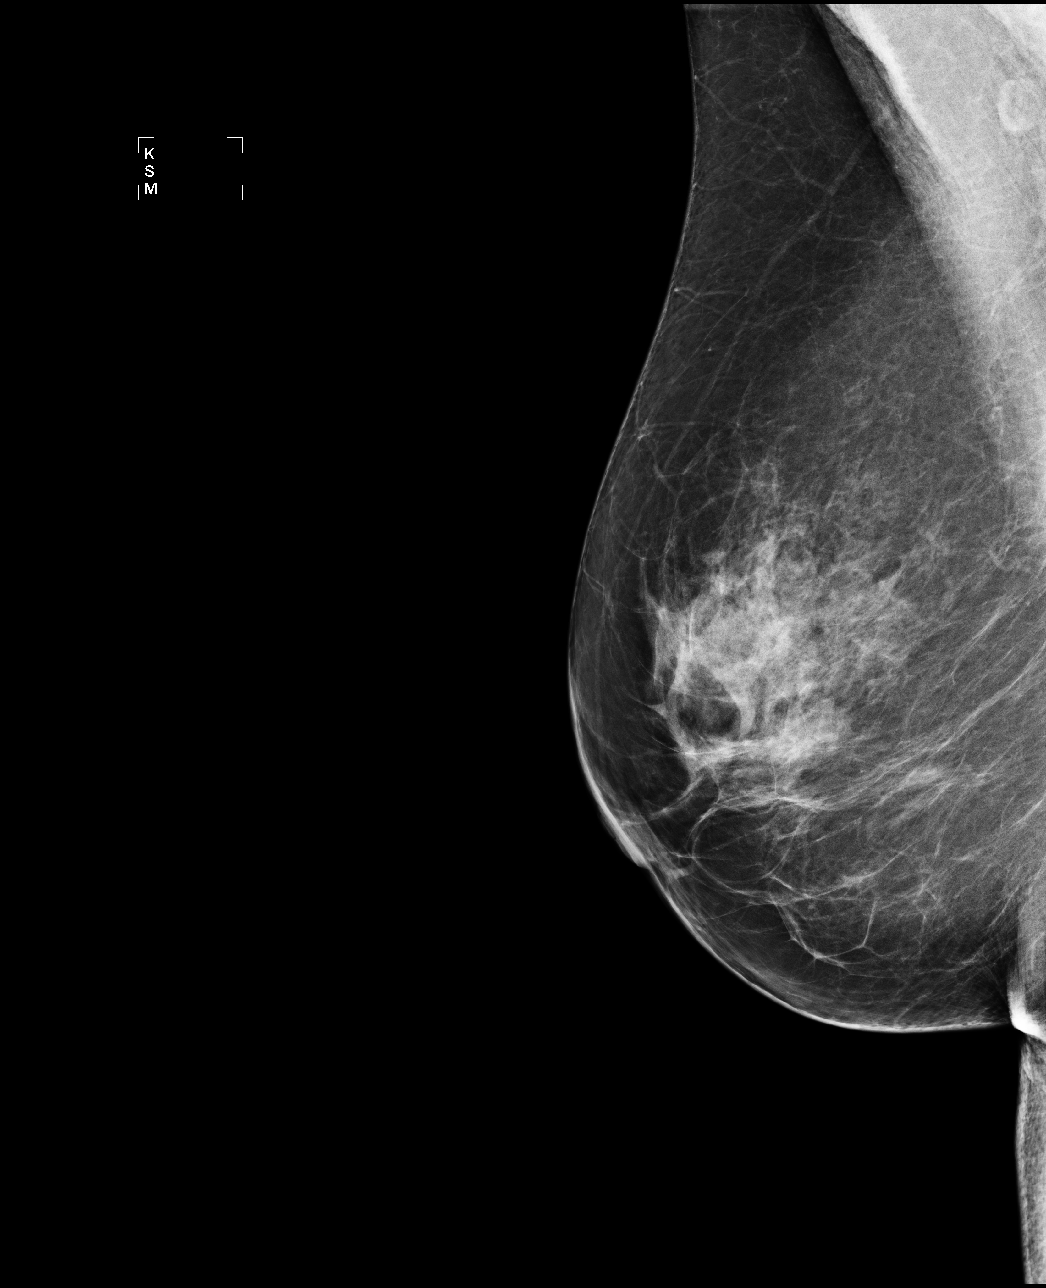

[4 of 4 positions shown; findings below may reference images not displayed]

ACR Breast Density Category c: The breast tissue is heterogeneously
dense, which may obscure small masses.
FINDINGS: There are no findings suspicious for malignancy. Images were
processed with CAD.
IMPRESSION: No mammographic evidence of malignancy. A result letter of this
screening mammogram will be mailed directly to the patient.

RECOMMENDATION:
Screening mammogram in one year. (Code:YJ-2-FEZ)

BI-RADS CATEGORY  1: Negative.

## 2017-08-02 ENCOUNTER — Ambulatory Visit: Payer: PRIVATE HEALTH INSURANCE | Admitting: Certified Nurse Midwife

## 2017-08-02 ENCOUNTER — Ambulatory Visit: Payer: PRIVATE HEALTH INSURANCE | Admitting: Nurse Practitioner

## 2017-08-09 ENCOUNTER — Ambulatory Visit: Payer: PRIVATE HEALTH INSURANCE | Admitting: Certified Nurse Midwife

## 2017-09-02 ENCOUNTER — Other Ambulatory Visit: Payer: Self-pay

## 2017-09-02 ENCOUNTER — Ambulatory Visit (INDEPENDENT_AMBULATORY_CARE_PROVIDER_SITE_OTHER): Payer: PRIVATE HEALTH INSURANCE | Admitting: Certified Nurse Midwife

## 2017-09-02 ENCOUNTER — Encounter: Payer: Self-pay | Admitting: Certified Nurse Midwife

## 2017-09-02 VITALS — BP 124/78 | HR 72 | Resp 16 | Ht 63.25 in | Wt 116.0 lb

## 2017-09-02 DIAGNOSIS — B002 Herpesviral gingivostomatitis and pharyngotonsillitis: Secondary | ICD-10-CM

## 2017-09-02 DIAGNOSIS — Z8679 Personal history of other diseases of the circulatory system: Secondary | ICD-10-CM | POA: Diagnosis not present

## 2017-09-02 DIAGNOSIS — F418 Other specified anxiety disorders: Secondary | ICD-10-CM

## 2017-09-02 DIAGNOSIS — Z01419 Encounter for gynecological examination (general) (routine) without abnormal findings: Secondary | ICD-10-CM

## 2017-09-02 DIAGNOSIS — E559 Vitamin D deficiency, unspecified: Secondary | ICD-10-CM

## 2017-09-02 DIAGNOSIS — N951 Menopausal and female climacteric states: Secondary | ICD-10-CM

## 2017-09-02 DIAGNOSIS — Z0000 Encounter for general adult medical examination without abnormal findings: Secondary | ICD-10-CM | POA: Diagnosis not present

## 2017-09-02 NOTE — Progress Notes (Signed)
61 y.o. [id number] Married  Caucasian Fe here for annual exam. Denies vaginal bleeding or vaginal dryness. Sees Urgent care if needed. Decided to not have a mammogram until 2019 this year. Patient has not noted any significant health issues in the past year. Has been considering establishing with PCP just in case. Still working and staying busy with family. Aware mammogram due, but plans to wait until 2019 to have. Some vaginal dryness, but no concerns. Has used OTC products with good results. Prozac continues to work well with her anxiety/depression.. The combination on Prozac 10/20mg  is taken daily with no problems, would like to continue. No HSV outbreaks, but likes to have medication available. Would like to plan vacation for next year!  Patient's last menstrual period was 05/09/2006 (exact date).          Sexually active: Yes.    The current method of family planning is tubal ligation.    Exercising: No.  exercise Smoker:  no  Health Maintenance: Pap:  07-23-15 neg HPV HR neg History of Abnormal Pap: no MMG:  07-23-14 category c density birads 1:neg Self Breast exams: no Colonoscopy:  2008 f/u 5843yrs BMD:   2005 TDaP:  2013 Shingles: no Pneumonia: had done Hep C and HIV: both neg 2016 Labs: yes   reports that  has never smoked. she has never used smokeless tobacco. She reports that she drinks about 3.6 oz of alcohol per week. She reports that she does not use drugs.  Past Medical History:  Diagnosis Date  . Renal calculi   . Situational depression     Past Surgical History:  Procedure Laterality Date  . TUBAL LIGATION Bilateral 1991    Current Outpatient Medications  Medication Sig Dispense Refill  . FLUoxetine (PROZAC) 10 MG capsule Take 1 capsule (10 mg total) by mouth daily. Take in addition to 20 mg tab. 90 capsule 4  . FLUoxetine (PROZAC) 20 MG capsule Take 1 capsule (20 mg total) by mouth daily. Take in addition to 10 mg tab 90 capsule 4  . valACYclovir (VALTREX) 1000 MG  tablet Take 1 tablet (1,000 mg total) by mouth 2 (two) times daily. 90 tablet 3   No current facility-administered medications for this visit.     Family History  Problem Relation Age of Onset  . Bone cancer Mother   . Thyroid disease Mother   . Diabetes Father   . Hyperlipidemia Father   . Hypertension Father   . Diabetes Sister     ROS:  Pertinent items are noted in HPI.  Otherwise, a comprehensive ROS was negative.  Exam:   BP 140/80   Pulse 70   Resp 16   Ht 5' 3.25" (1.607 m)   Wt 116 lb (52.6 kg)   LMP 05/09/2006 (Exact Date)   BMI 20.39 kg/m  Height: 5' 3.25" (160.7 cm) Ht Readings from Last 3 Encounters:  09/02/17 5' 3.25" (1.607 m)  07/28/16 5' 3.25" (1.607 m)  07/23/15 5' 3.5" (1.613 m)    General appearance: alert, cooperative and appears stated age Head: Normocephalic, without obvious abnormality, atraumatic Neck: no adenopathy, supple, symmetrical, trachea midline and thyroid normal to inspection and palpation Lungs: clear to auscultation bilaterally Breasts: normal appearance, no masses or tenderness, No nipple retraction or dimpling, No nipple discharge or bleeding, No axillary or supraclavicular adenopathy Heart: regular rate and rhythm, loud blowing type murmur heard( patient has had since childhood per history) Abdomen: soft, non-tender; no masses,  no organomegaly Extremities: extremities normal, atraumatic,  no cyanosis or edema Skin: Skin color, texture, turgor normal. No rashes or lesions Lymph nodes: Cervical, supraclavicular, and axillary nodes normal. No abnormal inguinal nodes palpated Neurologic: Grossly normal   Pelvic: External genitalia:  no lesions              Urethra:  normal appearing urethra with no masses, tenderness or lesions              Bartholin's and Skene's: normal                 Vagina: normal appearing vagina with normal color and discharge, no lesions              Cervix: multiparous appearance, no cervical motion  tenderness and no lesions              Pap taken: No. Bimanual Exam:  Uterus:  normal size, contour, position, consistency, mobility, non-tender and anteverted              Adnexa: normal adnexa and no mass, fullness, tenderness               Rectovaginal: Confirms               Anus:  normal sphincter tone, no lesions  Chaperone present: yes  A:  Well Woman with normal exam  Menopausal no HRT,  Vaginal dryness uses OTC products prn  Situational anxiety/depression with long term use of Prozac with good results, desires continuance  HSV history, no recent outbreaks  Heart murmur since childhood per patient,no follow up in "years"  Mammogram/BMD over due  Screening labs    P:   Reviewed health and wellness pertinent to exam  Discussed importance of advising if vaginal bleeding  Will advise if vaginal dryness is a problem  Discussed risks/benefits of Prozac use and warning signs. Patient aware and desires continuance. Discussed importance of never stopping suddenly, has to wean down.  Rx Prozac 10 mg see order   Rx Prozac 20 mg see order with instructions  Rx Valtrex see order with instructions  Discussed Heart murmur follow up as aging to avoid other health issues. Discussed also as we age other medical issues are addressed with PCP care . Patient would like to establish with someone closer to her home and will inquire of friends and call if she needs referral. Stressed I feel she should have follow up with murmur where she could also have further testing if needed usually in office EKG. Discussed that this is not something we can do here, but would glad to refer her. Patient will advise. Warning signs of heart changes reviewed, patient appreciative for information.  Discussed benefits of mammogram and early detection of breast changes. Recommend yearly and SBE which she does.  Also discussed BMD benefits and treatment if indicated to preserve a healthy life style. Patient has planned to do  all in 2019. Will call if she needs help scheduling.  Labs: Lipid panel, CBC,CMP, Vitamin D,TSH  Pap smear: no  counseled on breast self exam, mammography screening, feminine hygiene, menopause, adequate intake of calcium and vitamin D, diet and exercise return annually or prn  An After Visit Summary was printed and given to the patient.

## 2017-09-02 NOTE — Patient Instructions (Signed)

## 2017-09-03 LAB — CBC
Hematocrit: 43.1 % (ref 34.0–46.6)
Hemoglobin: 14.3 g/dL (ref 11.1–15.9)
MCH: 31.2 pg (ref 26.6–33.0)
MCHC: 33.2 g/dL (ref 31.5–35.7)
MCV: 94 fL (ref 79–97)
PLATELETS: 245 10*3/uL (ref 150–379)
RBC: 4.58 x10E6/uL (ref 3.77–5.28)
RDW: 12.9 % (ref 12.3–15.4)
WBC: 6.4 10*3/uL (ref 3.4–10.8)

## 2017-09-03 LAB — COMPREHENSIVE METABOLIC PANEL
ALBUMIN: 4.5 g/dL (ref 3.6–4.8)
ALT: 22 IU/L (ref 0–32)
AST: 27 IU/L (ref 0–40)
Albumin/Globulin Ratio: 1.9 (ref 1.2–2.2)
Alkaline Phosphatase: 81 IU/L (ref 39–117)
BUN / CREAT RATIO: 20 (ref 12–28)
BUN: 13 mg/dL (ref 8–27)
Bilirubin Total: 0.5 mg/dL (ref 0.0–1.2)
CALCIUM: 9.2 mg/dL (ref 8.7–10.3)
CO2: 23 mmol/L (ref 20–29)
CREATININE: 0.65 mg/dL (ref 0.57–1.00)
Chloride: 99 mmol/L (ref 96–106)
GFR, EST AFRICAN AMERICAN: 111 mL/min/{1.73_m2} (ref >59)
GFR, EST NON AFRICAN AMERICAN: 96 mL/min/{1.73_m2} (ref >59)
Globulin, Total: 2.4 g/dL (ref 1.5–4.5)
Glucose: 88 mg/dL (ref 65–99)
Potassium: 4.1 mmol/L (ref 3.5–5.2)
Sodium: 140 mmol/L (ref 134–144)
TOTAL PROTEIN: 6.9 g/dL (ref 6.0–8.5)

## 2017-09-03 LAB — LIPID PANEL
CHOLESTEROL TOTAL: 233 mg/dL — AB (ref 100–199)
Chol/HDL Ratio: 2.4 ratio (ref 0.0–4.4)
HDL: 98 mg/dL (ref >39)
LDL Calculated: 111 mg/dL — ABNORMAL HIGH (ref 0–99)
Triglycerides: 120 mg/dL (ref 0–149)
VLDL Cholesterol Cal: 24 mg/dL (ref 5–40)

## 2017-09-03 LAB — VITAMIN D 25 HYDROXY (VIT D DEFICIENCY, FRACTURES): VIT D 25 HYDROXY: 32.8 ng/mL (ref 30.0–100.0)

## 2017-09-03 LAB — TSH: TSH: 3.28 u[IU]/mL (ref 0.450–4.500)

## 2017-09-03 MED ORDER — FLUOXETINE HCL 20 MG PO CAPS: 20.0000 mg | ORAL_CAPSULE | Freq: Every day | ORAL | 4 refills | Status: DC

## 2017-09-03 MED ORDER — FLUOXETINE HCL 10 MG PO CAPS: 10.0000 mg | ORAL_CAPSULE | Freq: Every day | ORAL | 4 refills | Status: DC

## 2017-09-03 MED ORDER — VALACYCLOVIR HCL 1 G PO TABS: 1000.0000 mg | ORAL_TABLET | Freq: Two times a day (BID) | ORAL | 3 refills | Status: DC

## 2017-09-04 ENCOUNTER — Encounter: Payer: Self-pay | Admitting: Certified Nurse Midwife

## 2017-09-05 ENCOUNTER — Telehealth: Payer: Self-pay | Admitting: *Deleted

## 2017-09-05 NOTE — Telephone Encounter (Signed)
-----   Message from Verner Choleborah S Leonard, CNM sent at 09/04/2017  3:23 PM EST ----- Notify patient that her Vitamin D is borderline normal. Being in menopause the needs are greater for bone health, would recommend OTC Vitamin D 3 1000 IU daily to maintain a higher range. Previous years have been 47 and 57 TSH is normal Lipid panel shows cholesterol elevation of 233 previous year at 220,work on fiber in diet to help with this and low fat intake Triglycerides are  Normal,  HDL helpful cholesterol is great at 98 LDL cholesterol harmful cholesterol is 111 previous 112 normal is < 99 overall profile normal Liver, kidney and glucose profile is normal CBC is normal Discussed at visit establishing with PCP, she has heart murmur that is very loud( has had for years per patient) I could not compare sounds to visits in the past 4 years, no notation, so really would like her to get in to see PCP or cardio to follow. Please stress this again. She is not symptomatic. If needs help with this we need to refer her.

## 2017-09-05 NOTE — Telephone Encounter (Signed)
Message left to return call to Triage Nurse at [phone].    

## 2017-09-13 NOTE — Telephone Encounter (Signed)
Left message to call Para Cossey at [phone]. 

## 2017-09-30 NOTE — Telephone Encounter (Signed)
I will try to call patient.

## 2017-09-30 NOTE — Telephone Encounter (Signed)
Attempted to reach patient x 2 no return call. Please advise.

## 2017-10-07 ENCOUNTER — Telehealth: Payer: Self-pay

## 2017-10-07 NOTE — Telephone Encounter (Signed)
Left message to call Kinsey Karch at [phone]. 

## 2017-10-07 NOTE — Telephone Encounter (Signed)
Erroneous Encounter

## 2017-10-12 NOTE — Telephone Encounter (Signed)
Leota Sauerseborah Leonard CNM please advise.

## 2017-10-13 NOTE — Telephone Encounter (Signed)
I am going to send her a letter of concern.

## 2017-10-14 NOTE — Telephone Encounter (Signed)
Okay to close encounter?  °

## 2017-10-14 NOTE — Telephone Encounter (Signed)
yes

## 2017-10-17 ENCOUNTER — Telehealth: Payer: Self-pay | Admitting: Certified Nurse Midwife

## 2017-10-17 NOTE — Telephone Encounter (Signed)
[facility]

## 2017-10-17 NOTE — Telephone Encounter (Signed)
Spoke with patient. Results given. Patient verbalizes understanding. Patient states that she is in contact with a PCP and is working on getting an appointment. Declines assistance.  ----- Message from Verner Choleborah S Leonard, CNM sent at 09/04/2017  3:23 PM EST ----- Notify patient that her Vitamin D is borderline normal. Being in menopause the needs are greater for bone health, would recommend OTC Vitamin D 3 1000 IU daily to maintain a higher range. Previous years have been 47 and 57 TSH is normal Lipid panel shows cholesterol elevation of 233 previous year at 220,work on fiber in diet to help with this and low fat intake Triglycerides are  Normal,  HDL helpful cholesterol is great at 98 LDL cholesterol harmful cholesterol is 111 previous 112 normal is < 99 overall profile normal Liver, kidney and glucose profile is normal CBC is normal Discussed at visit establishing with PCP, she has heart murmur that is very loud( has had for years per patient) I could not compare sounds to visits in the past 4 years, no notation, so really would like her to get in to see PCP or cardio to follow. Please stress this again. She is not symptomatic. If needs help with this we need to refer her.  Routing to provider for final review. Patient agreeable to disposition. Will close encounter.

## 2018-03-03 ENCOUNTER — Telehealth: Payer: Self-pay

## 2018-03-03 NOTE — Telephone Encounter (Signed)
lmtcb

## 2018-03-03 NOTE — Telephone Encounter (Signed)
-----   Message from Verner Choleborah S Leonard, CNM sent at 03/03/2018  7:34 AM EDT ----- Regarding: Patient was to establish with PCP regarding heart sounds she had forever Please call and see if she has done this or needs help with. (Nora's mother)

## 2018-03-07 NOTE — Telephone Encounter (Signed)
No callback from patient. Please advise.

## 2018-03-15 NOTE — Telephone Encounter (Signed)
Patient is aware of the concern with the calls will close encounter regarding.

## 2018-09-15 ENCOUNTER — Other Ambulatory Visit: Payer: Self-pay

## 2018-09-15 ENCOUNTER — Telehealth: Payer: Self-pay | Admitting: *Deleted

## 2018-09-15 ENCOUNTER — Other Ambulatory Visit: Payer: Self-pay | Admitting: Certified Nurse Midwife

## 2018-09-15 ENCOUNTER — Other Ambulatory Visit (HOSPITAL_COMMUNITY)
Admission: RE | Admit: 2018-09-15 | Discharge: 2018-09-15 | Disposition: A | Discharge status: Home / Self Care | Payer: PRIVATE HEALTH INSURANCE | Source: Ambulatory Visit | Attending: Certified Nurse Midwife | Admitting: Certified Nurse Midwife

## 2018-09-15 ENCOUNTER — Encounter: Payer: Self-pay | Admitting: Certified Nurse Midwife

## 2018-09-15 ENCOUNTER — Ambulatory Visit (INDEPENDENT_AMBULATORY_CARE_PROVIDER_SITE_OTHER): Payer: PRIVATE HEALTH INSURANCE | Admitting: Certified Nurse Midwife

## 2018-09-15 VITALS — BP 140/84 | HR 68 | Resp 16 | Ht 63.25 in | Wt 120.0 lb

## 2018-09-15 DIAGNOSIS — E2839 Other primary ovarian failure: Secondary | ICD-10-CM

## 2018-09-15 DIAGNOSIS — Z0000 Encounter for general adult medical examination without abnormal findings: Secondary | ICD-10-CM | POA: Diagnosis not present

## 2018-09-15 DIAGNOSIS — Z124 Encounter for screening for malignant neoplasm of cervix: Secondary | ICD-10-CM | POA: Diagnosis present

## 2018-09-15 DIAGNOSIS — Z780 Asymptomatic menopausal state: Secondary | ICD-10-CM

## 2018-09-15 DIAGNOSIS — Z1231 Encounter for screening mammogram for malignant neoplasm of breast: Secondary | ICD-10-CM

## 2018-09-15 DIAGNOSIS — Z7689 Persons encountering health services in other specified circumstances: Secondary | ICD-10-CM

## 2018-09-15 DIAGNOSIS — B002 Herpesviral gingivostomatitis and pharyngotonsillitis: Secondary | ICD-10-CM

## 2018-09-15 DIAGNOSIS — Z1211 Encounter for screening for malignant neoplasm of colon: Secondary | ICD-10-CM

## 2018-09-15 DIAGNOSIS — E559 Vitamin D deficiency, unspecified: Secondary | ICD-10-CM

## 2018-09-15 DIAGNOSIS — Z01419 Encounter for gynecological examination (general) (routine) without abnormal findings: Secondary | ICD-10-CM

## 2018-09-15 DIAGNOSIS — R011 Cardiac murmur, unspecified: Secondary | ICD-10-CM

## 2018-09-15 MED ORDER — VALACYCLOVIR HCL 1 G PO TABS: 1000.0000 mg | ORAL_TABLET | Freq: Two times a day (BID) | ORAL | 3 refills | Status: DC

## 2018-09-15 NOTE — Telephone Encounter (Signed)
If April Spears is a patient/friend then we can take her, however, if not then she will have to choose someone else

## 2018-09-15 NOTE — Telephone Encounter (Signed)
Copied from CRM [id number]. Topic: Appointment Scheduling - New Patient >> Sep 15, 2018 10:40 AM Wyonia Hough E wrote: New patient has been referred to your office by [facility] from [facility]. Provider: Dr. Selena Batten   Route to department's [facility] pool.

## 2018-09-15 NOTE — Patient Instructions (Signed)

## 2018-09-15 NOTE — Progress Notes (Signed)
63 y.o. [id number] Married  Caucasian Fe here for annual exam. Menopausal no HRT. Denies hot flashes/night sweats now. No vaginal dryness or issues. Has not established with PCP. " just hasn't tried at this point" request referral. Has retired now and enjoying. No health issues today or any changes with activity, SOB or fatigue. Request labs. Prozac still working well for anxiety and request continuation. No HSV oral in past year, needs Rx update. Has scratches on legs from new puppy!  Patient's last menstrual period was 05/09/2006 (exact date).          Sexually active: Yes.    The current method of family planning is tubal ligation.    Exercising: No.  exercise Smoker:  no  Review of Systems  Constitutional: Negative.   HENT: Negative.   Eyes: Negative.   Respiratory: Negative.   Cardiovascular: Negative.   Gastrointestinal: Negative.   Genitourinary: Negative.   Musculoskeletal: Negative.   Skin: Negative.   Neurological: Negative.   Endo/Heme/Allergies: Negative.   Psychiatric/Behavioral: Negative.     Health Maintenance: Pap:  07-23-15 neg HPV HR neg History of Abnormal Pap: no MMG:  07-23-14 category c density birads 1:neg Self Breast exams: no Colonoscopy:  2008 f/u 66yrs BMD:   2005 TDaP:  2013 Shingles: had done Pneumonia: had done  Hep C and HIV: both neg 2016 Labs: yes   reports that she has never smoked. She has never used smokeless tobacco. She reports current alcohol use of about 6.0 standard drinks of alcohol per week. She reports that she does not use drugs.  Past Medical History:  Diagnosis Date  . History of heart murmur in childhood    She is not sure how long she has it, but was aware  . Renal calculi   . Situational depression     Past Surgical History:  Procedure Laterality Date  . TUBAL LIGATION Bilateral 1991    Current Outpatient Medications  Medication Sig Dispense Refill  . FLUoxetine (PROZAC) 10 MG capsule Take 1 capsule (10 mg total) by  mouth daily. Take in addition to 20 mg tab. 90 capsule 4  . FLUoxetine (PROZAC) 20 MG capsule Take 1 capsule (20 mg total) by mouth daily. Take in addition to 10 mg tab 90 capsule 4  . valACYclovir (VALTREX) 1000 MG tablet Take 1 tablet (1,000 mg total) by mouth 2 (two) times daily. 90 tablet 3   No current facility-administered medications for this visit.     Family History  Problem Relation Age of Onset  . Bone cancer Mother   . Thyroid disease Mother   . Diabetes Father   . Hyperlipidemia Father   . Hypertension Father   . Diabetes Sister     ROS:  Pertinent items are noted in HPI.  Otherwise, a comprehensive ROS was negative.  Exam:   BP 140/84   Pulse 68   Resp 16   Ht 5' 3.25" (1.607 m)   Wt 120 lb (54.4 kg)   LMP 05/09/2006 (Exact Date)   BMI 21.09 kg/m  Height: 5' 3.25" (160.7 cm) Ht Readings from Last 3 Encounters:  09/15/18 5' 3.25" (1.607 m)  09/02/17 5' 3.25" (1.607 m)  07/28/16 5' 3.25" (1.607 m)    General appearance: alert, cooperative and appears stated age Head: Normocephalic, without obvious abnormality, atraumatic Neck: no adenopathy, supple, symmetrical, trachea midline and thyroid normal to inspection and palpation Lungs: clear to auscultation bilaterally Breasts: normal appearance, no masses or tenderness, No nipple retraction or dimpling,  No nipple discharge or bleeding, No axillary or supraclavicular adenopathy Heart: regular rate and rhythm, heart murmur grade 3/4 blowing murmur sound (had since childhood Abdomen: soft, non-tender; no masses,  no organomegaly Extremities: extremities normal, atraumatic, no cyanosis or edema Skin: Skin color, texture, turgor normal. No rashes or lesions Lymph nodes: Cervical, supraclavicular, and axillary nodes normal. No abnormal inguinal nodes palpated Neurologic: Grossly normal   Pelvic: External genitalia:  no lesions, normal appearance               Urethra:  normal appearing urethra with no masses,  tenderness or lesions              Bartholin's and Skene's: normal                 Vagina: normal appearing vagina with normal color and discharge, no lesions, some dryness noted               Cervix: no cervical motion tenderness, no lesions and normal appearacne              Pap taken: Yes.   Bimanual Exam:  Uterus:  normal size, contour, position, consistency, mobility, non-tender and anteverted              Adnexa: normal adnexa and no mass, fullness, tenderness               Rectovaginal: Confirms               Anus:  normal sphincter tone, no lesions  Chaperone present: yes  A:  Well Woman with normal exam  Menopausal no HRT  Vaginal dryness  Situational anxiety Prozac working well  History of heart murmur no symptoms, needs PCP referral for follow up with age change  History of HSV 1 oral needs Rx update  Mammogram and BMD due  P:   Reviewed health and wellness pertinent to exam   Aware of need to advise if vaginal bleeding.  Discussed atrophic vaginitis finding and use of coconut or Olive oil for comfort and tissue improvement, using twice weekly. Patient will try and advise if concerns.  Discussed risks/benefits/warning signs of Prozac use, desires continuance.  Rx Prozac see order with instructions  Discussed referral to PCP for establishment of care and for follow up of heart murmur. Patient agreeable and will be called with referral information.  Rx Valtrex see order with instructions  Colonoscopy and mammogram due, needs help with scheduling. She will be scheduled prior to leaving today.  Pap smear: yes   counseled on breast self exam, mammography screening, feminine hygiene, menopause, adequate intake of calcium and vitamin D, diet and exercise  return annually or prn  An After Visit Summary was printed and given to the patient.

## 2018-09-15 NOTE — Progress Notes (Signed)
Screening mammogram and Bone Density scheduled for 10/20/2018 at 0645 [facility] imaging.

## 2018-09-16 LAB — LIPID PANEL
Chol/HDL Ratio: 3 ratio (ref 0.0–4.4)
Cholesterol, Total: 231 mg/dL — ABNORMAL HIGH (ref 100–199)
HDL: 78 mg/dL (ref >39)
LDL Calculated: 135 mg/dL — ABNORMAL HIGH (ref 0–99)
Triglycerides: 91 mg/dL (ref 0–149)
VLDL Cholesterol Cal: 18 mg/dL (ref 5–40)

## 2018-09-16 LAB — CBC
Hematocrit: 42.3 % (ref 34.0–46.6)
Hemoglobin: 13.9 g/dL (ref 11.1–15.9)
MCH: 31.2 pg (ref 26.6–33.0)
MCHC: 32.9 g/dL (ref 31.5–35.7)
MCV: 95 fL (ref 79–97)
Platelets: 240 10*3/uL (ref 150–450)
RBC: 4.46 x10E6/uL (ref 3.77–5.28)
RDW: 11.8 % (ref 11.7–15.4)
WBC: 4 10*3/uL (ref 3.4–10.8)

## 2018-09-16 LAB — COMPREHENSIVE METABOLIC PANEL
A/G RATIO: 2 (ref 1.2–2.2)
ALK PHOS: 62 IU/L (ref 39–117)
ALT: 24 IU/L (ref 0–32)
AST: 26 IU/L (ref 0–40)
Albumin: 4.3 g/dL (ref 3.6–4.8)
BUN/Creatinine Ratio: 19 (ref 12–28)
BUN: 14 mg/dL (ref 8–27)
Bilirubin Total: 0.4 mg/dL (ref 0.0–1.2)
CALCIUM: 9 mg/dL (ref 8.7–10.3)
CO2: 22 mmol/L (ref 20–29)
Chloride: 99 mmol/L (ref 96–106)
Creatinine, Ser: 0.72 mg/dL (ref 0.57–1.00)
GFR calc Af Amer: 104 mL/min/{1.73_m2} (ref >59)
GFR, EST NON AFRICAN AMERICAN: 90 mL/min/{1.73_m2} (ref >59)
Globulin, Total: 2.1 g/dL (ref 1.5–4.5)
Glucose: 81 mg/dL (ref 65–99)
POTASSIUM: 4.4 mmol/L (ref 3.5–5.2)
Sodium: 138 mmol/L (ref 134–144)
Total Protein: 6.4 g/dL (ref 6.0–8.5)

## 2018-09-16 LAB — TSH: TSH: 2.65 u[IU]/mL (ref 0.450–4.500)

## 2018-09-16 LAB — VITAMIN D 25 HYDROXY (VIT D DEFICIENCY, FRACTURES): VIT D 25 HYDROXY: 39.9 ng/mL (ref 30.0–100.0)

## 2018-09-18 LAB — CYTOLOGY - PAP
DIAGNOSIS: NEGATIVE
HPV (WINDOPATH): NOT DETECTED

## 2018-09-19 ENCOUNTER — Telehealth: Payer: Self-pay

## 2018-09-19 NOTE — Telephone Encounter (Signed)
Left message to call April Spears at [phone].  Notes recorded by Eliezer Bottom, CMA on 09/19/2018 at 10:55 AM EST aex is 1/21 ------  Notes recorded by Verner Chol, CNM on 09/19/2018 at 8:07 AM EST Pap smear reviewed negative HPV not detected 02 ------  Notes recorded by Verner Chol, CNM on 09/17/2018 at 9:18 PM EST Notify patent her Vitamin D is normal at 39.8, but due to menopause a level of 40 to 50 is desired. Work on Vitamin D sources in diet this should help TSH is normal CBC is normal no anemia Lipid panel shows elevation at 231 previous was 233 and LDL is 135, previous was 111, Triglycerides are normal at 91 <149 normal HDL is 78 Normal >39 She will need to work on cholesterol management with the PCP we have referred her to. She will need copy for her visit. Increase fruits, vegetables, fiber in diet and lean protein to help decrease Liver,kidney, glucose profile is normal Pap smear pending

## 2018-09-21 NOTE — Telephone Encounter (Signed)
Spoke with patient. Advised of results as seen below from [employer] CNM. Patient verbalizes understanding. Results routed to Kriste Basque. M.D. Encounter closed.

## 2018-09-26 ENCOUNTER — Other Ambulatory Visit: Payer: Self-pay | Admitting: Certified Nurse Midwife

## 2018-09-26 DIAGNOSIS — F418 Other specified anxiety disorders: Secondary | ICD-10-CM

## 2018-09-26 NOTE — Telephone Encounter (Signed)
I sent refills in for her Prozac , please reverify this is needed with pharmacy

## 2018-09-26 NOTE — Telephone Encounter (Signed)
Medication refill request: FLUoxetine 10mg  and 20mg   Last AEX: 09/15/18 DL Next AEX: 5/63/87 Last MMG (if hormonal medication request): 07-23-14 category c density birads 1:neg -- scheduled 11/10/18  Refill authorized: 09/03/17 #90 w/4 refills; please advise; order has been adjusted to last patient until her next AEX if authorized.

## 2018-09-28 NOTE — Telephone Encounter (Signed)
Patient has been notified that prescription sent to pharmacy 

## 2018-09-28 NOTE — Telephone Encounter (Signed)
Per pharmacy, the last prescription that was sent for the Prozac was sent 09/03/17. Please advise on refill.

## 2018-09-28 NOTE — Telephone Encounter (Signed)
Patient is calling regarding refill request.  °

## 2018-10-20 ENCOUNTER — Ambulatory Visit: Payer: PRIVATE HEALTH INSURANCE

## 2018-10-20 ENCOUNTER — Other Ambulatory Visit: Payer: PRIVATE HEALTH INSURANCE

## 2018-11-10 ENCOUNTER — Ambulatory Visit
Admission: RE | Admit: 2018-11-10 | Discharge: 2018-11-10 | Disposition: A | Discharge status: Home / Self Care | Payer: PRIVATE HEALTH INSURANCE | Source: Ambulatory Visit | Attending: Certified Nurse Midwife | Admitting: Certified Nurse Midwife

## 2018-11-10 DIAGNOSIS — Z780 Asymptomatic menopausal state: Secondary | ICD-10-CM

## 2018-11-10 DIAGNOSIS — E2839 Other primary ovarian failure: Secondary | ICD-10-CM

## 2018-11-10 DIAGNOSIS — Z1231 Encounter for screening mammogram for malignant neoplasm of breast: Secondary | ICD-10-CM

## 2018-11-22 ENCOUNTER — Telehealth: Payer: Self-pay

## 2018-11-22 NOTE — Telephone Encounter (Signed)
Left several messages to callback. Released to mychart & patient has not viewed results.

## 2018-11-22 NOTE — Telephone Encounter (Signed)
-----   Message from Verner Chol, CNM sent at 11/11/2018  2:10 PM EST ----- Notify patient her bone density shows osteopenia(low bone mass ) in left femur neck and spine. She needs to add algae based calcium to her supplement 600 mg twice daily with Vitamin D Weight bearing exercise such as walking and Yoga is also great for being flexible. Recheck BMD in 2 years

## 2018-11-23 NOTE — Telephone Encounter (Signed)
Letter sent to patient.

## 2018-12-15 ENCOUNTER — Encounter: Payer: Self-pay | Admitting: Obstetrics and Gynecology

## 2018-12-15 NOTE — Telephone Encounter (Signed)
No callback from paitent. Routed to dr Edward Jolly to see if encounter can be closed.

## 2019-01-22 NOTE — Telephone Encounter (Signed)
Patient is on My Chart, so I suspect she has seen the results from the bone density.  Ok that encounter is closed.

## 2019-01-22 NOTE — Telephone Encounter (Signed)
No callback from patient. Okay to close?

## 2019-09-19 NOTE — Progress Notes (Signed)
64 y.o. G2P0101 Married  Caucasian Fe here for annual exam. Menopausal no vaginal bleeding. Some vaginal dryness will use coconut oil if needed. 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 Prozac still working well, desires continuance. No HSV outbreak, but would like to update Rx. Staying active, has lost 2 pounds, feeling well. No complaints of chest pain or SOB.Fasted for screening labs. No other health issues today.  Patient's last menstrual period was 05/09/2006 (exact date).          Sexually active: Yes.    The current method of family planning is tubal ligation.    Exercising: Yes.    walking Smoker:  no  Review of Systems  Constitutional: Negative.   HENT: Negative.   Eyes: Negative.   Respiratory: Negative.   Cardiovascular: Negative.   Gastrointestinal: Negative.   Genitourinary: Negative.   Musculoskeletal: Negative.   Skin: Negative.   Neurological: Negative.   Endo/Heme/Allergies: Negative.   Psychiatric/Behavioral: Negative.     Health Maintenance: Pap:  07-23-15 neg HPV HR neg, 09-15-2018 neg HPV HR neg History of Abnormal Pap: no MMG:  11-10-2018 category c density birads 1:neg Self Breast exams: occ Colonoscopy:  2008 f/u 50yrs, not done  BMD:   2020 osteopenia TDaP:  2013 Shingles: had done Pneumonia: had done Hep C and HIV: both neg 2016 Labs: yes   reports that she has never smoked. She has never used smokeless tobacco. She reports current alcohol use of about 6.0 standard drinks of alcohol per week. She reports that she does not use drugs.  Past Medical History:  Diagnosis Date  . History of heart murmur in childhood    She is not sure how long she has it, but was aware  . Osteopenia   . Renal calculi   . Situational depression     Past Surgical History:  Procedure Laterality Date  . TUBAL LIGATION Bilateral 1991    Current Outpatient Medications  Medication Sig Dispense Refill  . FLUoxetine (PROZAC) 10 MG capsule TAKE 1 CAPSULE BY MOUTH ONCE DAILY  ALONG  WITH  20MG  90 capsule 4  . FLUoxetine (PROZAC) 20 MG capsule TAKE 1 CAPSULE BY MOUTH ONCE DAILY ALONG  WITH  10MG  90 capsule 4  . valACYclovir (VALTREX) 1000 MG tablet Take 1 tablet (1,000 mg total) by mouth 2 (two) times daily. 90 tablet 3   No current facility-administered medications for this visit.    Family History  Problem Relation Age of Onset  . Bone cancer Mother   . Thyroid disease Mother   . Diabetes Father   . Hyperlipidemia Father   . Hypertension Father   . Diabetes Sister     ROS:  Pertinent items are noted in HPI.  Otherwise, a comprehensive ROS was negative.  Exam:   LMP 05/09/2006 (Exact Date)    Ht Readings from Last 3 Encounters:  09/15/18 5' 3.25" (1.607 m)  09/02/17 5' 3.25" (1.607 m)  07/28/16 5' 3.25" (1.607 m)    General appearance: alert, cooperative and appears stated age Head: Normocephalic, without obvious abnormality, atraumatic Neck: no adenopathy, supple, symmetrical, trachea midline and thyroid normal to inspection and palpation Lungs: clear to auscultation bilaterally Breasts: normal appearance, no masses or tenderness, No nipple retraction or dimpling, No nipple discharge or bleeding, No axillary or supraclavicular adenopathy Heart: regular rate and rhythm with murmur grade 3/4 still heard,no change Abdomen: soft, non-tender; no masses,  no organomegaly Extremities: extremities normal, atraumatic, no cyanosis or edema Skin: Skin color, texture, turgor normal. No  rashes or lesions Lymph nodes: Cervical, supraclavicular, and axillary nodes normal. No abnormal inguinal nodes palpated Neurologic: Grossly normal   Pelvic: External genitalia:  no lesions, slight atrophic appearance.              Urethra:  normal appearing urethra with no masses, tenderness or lesions              Bartholin's and Skene's: normal                 Vagina: normal appearing vagina with normal color and discharge, no lesions              Cervix: multiparous  appearance, no cervical motion tenderness and no lesions              Pap taken: No. Bimanual Exam:  Uterus:  normal size, contour, position, consistency, mobility, non-tender and anteverted              Adnexa: normal adnexa and no mass, fullness, tenderness               Rectovaginal: Confirms               Anus:  normal sphincter tone, no lesions  Chaperone present: yes  A:  Well Woman with normal exam  Menopausal no HRT  Anxiety/depression Prozac working well, desires continuance  History of Herpes, no outbreak but desires Valtrex update  Heart murmur since childhood, no change, not symptomatic  Colonoscopy due  Screening labs  P:   Reviewed health and wellness pertinent to exam  Aware of need to advise if vaginal bleeding  Denies any side effects with Prozac use desires continuance.  Rx Prozac 10 mg,Rx Prozac 20 mg see order with  instructions for use  Rx Valtrex see order with instructions  Reviewed if she has chest pain or changes needs to be seen in ER. Patient is going to establish with MD in [place].  Aware colonoscopy due request referral to Dr.Mann again.  Labs: CBC,CMP,Lipid Panel, TSH, Vitamin D  Pap smear: no   counseled on breast self exam, mammography screening, feminine hygiene, adequate intake of calcium and vitamin D, diet and exercise  return annually or prn  An After Visit Summary was printed and given to the patient.

## 2019-09-20 ENCOUNTER — Ambulatory Visit: Payer: PRIVATE HEALTH INSURANCE | Admitting: Certified Nurse Midwife

## 2019-09-21 ENCOUNTER — Ambulatory Visit: Payer: Commercial Managed Care - PPO | Admitting: Certified Nurse Midwife

## 2019-09-21 ENCOUNTER — Encounter: Payer: Self-pay | Admitting: Certified Nurse Midwife

## 2019-09-21 ENCOUNTER — Other Ambulatory Visit: Payer: Self-pay

## 2019-09-21 VITALS — BP 110/70 | HR 68 | Temp 98.2°F | Resp 16 | Ht 63.25 in | Wt 117.0 lb

## 2019-09-21 DIAGNOSIS — Z0000 Encounter for general adult medical examination without abnormal findings: Secondary | ICD-10-CM | POA: Diagnosis not present

## 2019-09-21 DIAGNOSIS — E559 Vitamin D deficiency, unspecified: Secondary | ICD-10-CM | POA: Diagnosis not present

## 2019-09-21 DIAGNOSIS — Z01419 Encounter for gynecological examination (general) (routine) without abnormal findings: Secondary | ICD-10-CM

## 2019-09-21 DIAGNOSIS — Z1211 Encounter for screening for malignant neoplasm of colon: Secondary | ICD-10-CM

## 2019-09-21 NOTE — Patient Instructions (Signed)
EXERCISE AND DIET:  We recommended that you start or continue a regular exercise program for good health. Regular exercise means any activity that makes your heart beat faster and makes you sweat.  We recommend exercising at least 30 minutes per day at least 3 days a week, preferably 4 or 5.  We also recommend a diet low in fat and sugar.  Inactivity, poor dietary choices and obesity can cause diabetes, heart attack, stroke, and kidney damage, among others.    ALCOHOL AND SMOKING:  Women should limit their alcohol intake to no more than 7 drinks/beers/glasses of wine (combined, not each!) per week. Moderation of alcohol intake to this level decreases your risk of breast cancer and liver damage. And of course, no recreational drugs are part of a healthy lifestyle.  And absolutely no smoking or even second hand smoke. Most people know smoking can cause heart and lung diseases, but did you know it also contributes to weakening of your bones? Aging of your skin?  Yellowing of your teeth and nails?  CALCIUM AND VITAMIN D:  Adequate intake of calcium and Vitamin D are recommended.  The recommendations for exact amounts of these supplements seem to change often, but generally speaking 600 mg of calcium (either carbonate or citrate) and 800 units of Vitamin D per day seems prudent. Certain women may benefit from higher intake of Vitamin D.  If you are among these women, your doctor will have told you during your visit.    PAP SMEARS:  Pap smears, to check for cervical cancer or precancers,  have traditionally been done yearly, although recent scientific advances have shown that most women can have pap smears less often.  However, every woman still should have a physical exam from her gynecologist every year. It will include a breast check, inspection of the vulva and vagina to check for abnormal growths or skin changes, a visual exam of the cervix, and then an exam to evaluate the size and shape of the uterus and  ovaries.  And after 64 years of age, a rectal exam is indicated to check for rectal cancers. We will also provide age appropriate advice regarding health maintenance, like when you should have certain vaccines, screening for sexually transmitted diseases, bone density testing, colonoscopy, mammograms, etc.   MAMMOGRAMS:  All women over 40 years old should have a yearly mammogram. Many facilities now offer a "3D" mammogram, which may cost around $50 extra out of pocket. If possible,  we recommend you accept the option to have the 3D mammogram performed.  It both reduces the number of women who will be called back for extra views which then turn out to be normal, and it is better than the routine mammogram at detecting truly abnormal areas.    COLONOSCOPY:  Colonoscopy to screen for colon cancer is recommended for all women at age 50.  We know, you hate the idea of the prep.  We agree, BUT, having colon cancer and not knowing it is worse!!  Colon cancer so often starts as a polyp that can be seen and removed at colonscopy, which can quite literally save your life!  And if your first colonoscopy is normal and you have no family history of colon cancer, most women don't have to have it again for 10 years.  Once every ten years, you can do something that may end up saving your life, right?  We will be happy to help you get it scheduled when you are ready.    Be sure to check your insurance coverage so you understand how much it will cost.  It may be covered as a preventative service at no cost, but you should check your particular policy.      Osteopenia  Osteopenia is a loss of thickness (density) inside of the bones. Another name for osteopenia is low bone mass. Mild osteopenia is a normal part of aging. It is not a disease, and it does not cause symptoms. However, if you have osteopenia and continue to lose bone mass, you could develop a condition that causes the bones to become thin and break more easily  (osteoporosis). You may also lose some height, have back pain, and have a stooped posture. Although osteopenia is not a disease, making changes to your lifestyle and diet can help to prevent osteopenia from developing into osteoporosis. What are the causes? Osteopenia is caused by loss of calcium in the bones.  Bones are constantly changing. Old bone cells are continually being replaced with new bone cells. This process builds new bone. The mineral calcium is needed to build new bone and maintain bone density. Bone density is usually highest around age 35. After that, most people's bodies cannot replace all the bone they have lost with new bone. What increases the risk? You are more likely to develop this condition if:  You are older than age 50.  You are a woman who went through menopause early.  You have a long illness that keeps you in bed.  You do not get enough exercise.  You lack certain nutrients (malnutrition).  You have an overactive thyroid gland (hyperthyroidism).  You smoke.  You drink a lot of alcohol.  You are taking medicines that weaken the bones, such as steroids. What are the signs or symptoms? This condition does not cause any symptoms. You may have a slightly higher risk for bone breaks (fractures), so getting fractures more easily than normal may be an indication of osteopenia. How is this diagnosed? Your health care provider can diagnose this condition with a special type of X-ray exam that measures bone density (dual-energy X-ray absorptiometry, DEXA). This test can measure bone density in your hips, spine, and wrists. Osteopenia has no symptoms, so this condition is usually diagnosed after a routine bone density screening test is done for osteoporosis. This routine screening is usually done for:  Women who are age 65 or older.  Men who are age 70 or older. If you have risk factors for osteopenia, you may have the screening test at an earlier age. How is this  treated? Making dietary and lifestyle changes can lower your risk for osteoporosis. If you have severe osteopenia that is close to becoming osteoporosis, your health care provider may prescribe medicines and dietary supplements such as calcium and vitamin D. These supplements help to rebuild bone density. Follow these instructions at home:   Take over-the-counter and prescription medicines only as told by your health care provider. These include vitamins and supplements.  Eat a diet that is high in calcium and vitamin D. ? Calcium is found in dairy products, beans, salmon, and leafy green vegetables like spinach and broccoli. ? Look for foods that have vitamin D and calcium added to them (fortified foods), such as orange juice, cereal, and bread.  Do 30 or more minutes of a weight-bearing exercise every day, such as walking, jogging, or playing a sport. These types of exercises strengthen the bones.  Take precautions at home to lower your risk of   falling, such as: ? Keeping rooms well-lit and free of clutter, such as cords. ? Installing safety rails on stairs. ? Using rubber mats in the bathroom or other areas that are often wet or slippery.  Do not use any products that contain nicotine or tobacco, such as cigarettes and e-cigarettes. If you need help quitting, ask your health care provider.  Avoid alcohol or limit alcohol intake to no more than 1 drink a day for nonpregnant women and 2 drinks a day for men. One drink equals 12 oz of beer, 5 oz of wine, or 1 oz of hard liquor.  Keep all follow-up visits as told by your health care provider. This is important. Contact a health care provider if:  You have not had a bone density screening for osteoporosis and you are: ? A woman, age 35 or older. ? A man, age 13 or older.  You are a postmenopausal woman who has not had a bone density screening for osteoporosis.  You are older than age 87 and you want to know if you should have bone  density screening for osteoporosis. Summary  Osteopenia is a loss of thickness (density) inside of the bones. Another name for osteopenia is low bone mass.  Osteopenia is not a disease, but it may increase your risk for a condition that causes the bones to become thin and break more easily (osteoporosis).  You may be at risk for osteopenia if you are older than age 66 or if you are a woman who went through early menopause.  Osteopenia does not cause any symptoms, but it can be diagnosed with a bone density screening test.  Dietary and lifestyle changes are the first treatment for osteopenia. These may lower your risk for osteoporosis. This information is not intended to replace advice given to you by your health care provider. Make sure you discuss any questions you have with your health care provider. Document Revised: 08/05/2017 Document Reviewed: 06/01/2017 Elsevier Patient Education  2020 [employer].

## 2019-09-22 LAB — COMPREHENSIVE METABOLIC PANEL
ALT: 16 IU/L (ref 0–32)
AST: 21 IU/L (ref 0–40)
Albumin/Globulin Ratio: 1.9 (ref 1.2–2.2)
Albumin: 4.3 g/dL (ref 3.8–4.8)
Alkaline Phosphatase: 75 IU/L (ref 39–117)
BUN/Creatinine Ratio: 16 (ref 12–28)
BUN: 10 mg/dL (ref 8–27)
Bilirubin Total: 0.5 mg/dL (ref 0.0–1.2)
CO2: 26 mmol/L (ref 20–29)
Calcium: 8.9 mg/dL (ref 8.7–10.3)
Chloride: 102 mmol/L (ref 96–106)
Creatinine, Ser: 0.61 mg/dL (ref 0.57–1.00)
GFR calc Af Amer: 112 mL/min/{1.73_m2} (ref >59)
GFR calc non Af Amer: 97 mL/min/{1.73_m2} (ref >59)
Globulin, Total: 2.3 g/dL (ref 1.5–4.5)
Glucose: 87 mg/dL (ref 65–99)
Potassium: 4 mmol/L (ref 3.5–5.2)
Sodium: 142 mmol/L (ref 134–144)
Total Protein: 6.6 g/dL (ref 6.0–8.5)

## 2019-09-22 LAB — VITAMIN D 25 HYDROXY (VIT D DEFICIENCY, FRACTURES): Vit D, 25-Hydroxy: 40.1 ng/mL (ref 30.0–100.0)

## 2019-09-22 LAB — LIPID PANEL
Chol/HDL Ratio: 2.8 ratio (ref 0.0–4.4)
Cholesterol, Total: 199 mg/dL (ref 100–199)
HDL: 72 mg/dL (ref >39)
LDL Chol Calc (NIH): 103 mg/dL — ABNORMAL HIGH (ref 0–99)
Triglycerides: 138 mg/dL (ref 0–149)
VLDL Cholesterol Cal: 24 mg/dL (ref 5–40)

## 2019-09-22 LAB — CBC
Hematocrit: 43.5 % (ref 34.0–46.6)
Hemoglobin: 14.4 g/dL (ref 11.1–15.9)
MCH: 31.2 pg (ref 26.6–33.0)
MCHC: 33.1 g/dL (ref 31.5–35.7)
MCV: 94 fL (ref 79–97)
Platelets: 274 10*3/uL (ref 150–450)
RBC: 4.62 x10E6/uL (ref 3.77–5.28)
RDW: 11.9 % (ref 11.7–15.4)
WBC: 5.2 10*3/uL (ref 3.4–10.8)

## 2019-09-22 LAB — TSH: TSH: 2.41 u[IU]/mL (ref 0.450–4.500)

## 2019-10-04 ENCOUNTER — Other Ambulatory Visit: Payer: Self-pay | Admitting: Certified Nurse Midwife

## 2019-10-04 DIAGNOSIS — F418 Other specified anxiety disorders: Secondary | ICD-10-CM

## 2019-10-04 NOTE — Telephone Encounter (Signed)
Medication refill request: Prozac 20mg  #30, R11 Last AEX:  09-21-19 Next AEX: 09-24-20 Last MMG (if hormonal medication request): n/a Refill authorized: refill if appropriate

## 2019-11-13 ENCOUNTER — Other Ambulatory Visit: Payer: Self-pay | Admitting: Certified Nurse Midwife

## 2019-11-13 DIAGNOSIS — F418 Other specified anxiety disorders: Secondary | ICD-10-CM

## 2019-11-13 NOTE — Telephone Encounter (Signed)
Medication refill request: prozac 10mg  Last AEX:  09-21-2019 Next AEX: 09-24-2020 Last MMG (if hormonal medication request): n/a Refill authorized: prozac 20mg  was sent at [facility] but the 10mg  was not sent. Please approve if appropriate

## 2019-11-27 ENCOUNTER — Encounter: Payer: Self-pay | Admitting: Certified Nurse Midwife

## 2020-09-17 ENCOUNTER — Other Ambulatory Visit: Payer: Self-pay

## 2020-09-17 ENCOUNTER — Other Ambulatory Visit: Payer: Self-pay | Admitting: Nurse Practitioner

## 2020-09-17 DIAGNOSIS — F418 Other specified anxiety disorders: Secondary | ICD-10-CM

## 2020-09-17 MED ORDER — FLUOXETINE HCL 10 MG PO CAPS: ORAL_CAPSULE | ORAL | 0 refills | Status: DC

## 2020-09-17 NOTE — Telephone Encounter (Signed)
Last annual exam 09/21/19 with Leota Sauers, CNM.  Patient is scheduled with Clarita Crane, NP for annual exam 09/25/20.  Requesting refill Fluoxetine 10 mg.

## 2020-09-23 NOTE — Progress Notes (Signed)
65 y.o. G48P0101 Married White or Caucasian female here for annual exam.    Retired 2 years ago from [employer]. Has taken up some hobbies sewing, cognitive brain games and reading. Husband retired this month.  Emotionally doing okay, stable on Prozac and desires to continue. No major depressive episodes.   Patient's last menstrual period was 05/09/2006 (exact date).          Sexually active: Yes.    The current method of family planning is tubal ligation.    Exercising: No.  exercise Smoker:  no  Health Maintenance: Pap:  09-15-2018 neg HPV HR neg History of abnormal Pap:  no MMG:  11-10-2018 category c density birads 1:neg Colonoscopy:  2008 f/u 42yr, not done BMD:   2020 osteopenia TDaP:  2013 Gardasil:   n/a Covid-19: moderna Hep C testing: neg 2016 Screening Labs: will call    reports that she has never smoked. She has never used smokeless tobacco. She reports current alcohol use of about 6.0 standard drinks of alcohol per week. She reports that she does not use drugs.  Past Medical History:  Diagnosis Date  . History of heart murmur in childhood    She is not sure how long she has it, but was aware  . Osteopenia   . Renal calculi   . Situational depression     Past Surgical History:  Procedure Laterality Date  . TUBAL LIGATION Bilateral 1991    Current Outpatient Medications  Medication Sig Dispense Refill  . FLUoxetine (PROZAC) 10 MG capsule TAKE 1 CAPSULE BY MOUTH ONCE DAILY ALONG WITH 20 MG 30 capsule 0  . FLUoxetine (PROZAC) 20 MG capsule TAKE 1 CAPSULE BY MOUTH ONCE DAILY ALONG WITH 10 MG 30 capsule 11  . valACYclovir (VALTREX) 1000 MG tablet Take 1 tablet (1,000 mg total) by mouth 2 (two) times daily. (Patient taking differently: Take 1,000 mg by mouth as needed.) 90 tablet 3  . VITAMIN D PO Take by mouth.     No current facility-administered medications for this visit.    Family History  Problem Relation Age of Onset  . Bone cancer Mother   . Thyroid  disease Mother   . Diabetes Father   . Hyperlipidemia Father   . Hypertension Father   . Diabetes Sister     Review of Systems  Constitutional: Negative.   HENT: Negative.   Eyes: Negative.   Respiratory: Negative.   Cardiovascular: Negative.   Gastrointestinal: Negative.   Endocrine: Negative.   Genitourinary: Negative.   Musculoskeletal: Negative.   Skin: Negative.   Allergic/Immunologic: Negative.   Neurological: Negative.   Hematological: Negative.   Psychiatric/Behavioral: Negative.     Exam:   BP 130/80   Pulse 68   Resp 16   Ht 5' 3.5" (1.613 m)   Wt 120 lb (54.4 kg)   LMP 05/09/2006 (Exact Date)   BMI 20.92 kg/m   Height: 5' 3.5" (161.3 cm)  General appearance: alert, cooperative and appears stated age, no acute distress Head: Normocephalic, without obvious abnormality Neck: no adenopathy, thyroid normal to inspection and palpation Lungs: clear to auscultation bilaterally Breasts: normal appearance, no masses or tenderness Heart: regular rate and rhythm, 4/6 murmur auscultated, loudest over pulmonic area Abdomen: soft, non-tender; no masses,  no organomegaly Extremities: extremities normal, no edema Skin: No rashes or lesions Lymph nodes: Cervical, supraclavicular, and axillary nodes normal. No abnormal inguinal nodes palpated Neurologic: Grossly normal   Pelvic: External genitalia:  no lesions  Urethra:  normal appearing urethra with no masses, tenderness or lesions              Bartholins and Skenes: normal                 Vagina: normal appearing vagina, appropriate for age, normal appearing discharge, no lesions              Cervix: neg cervical motion tenderness, no visible lesions             Bimanual Exam:   Uterus:  normal size, contour, position, consistency, mobility, non-tender              Adnexa: normal adnexa               Rectal: confirms above, no mass palpable  Lincy, CMA Chaperone was present for exam.  A:  Well Woman  with normal exam Well woman exam with routine gynecological exam - Plan: Cologuard, Lipid panel, CBC, Comp Met (CMET)  Osteopenia, unspecified location - Plan: DG Bone Density  Heart murmur - Plan: Ambulatory referral to Cardiology  Depression with anxiety - Plan: FLUoxetine (PROZAC) 10 MG capsule, FLUoxetine (PROZAC) 20 MG capsule  Oral herpes simplex infection - Plan: valACYclovir (VALTREX) 1000 MG tablet    P:   Pap : cotest done 2020, next due 2025  Mammogram: Encouraged patient to schedule. Advised she can schedule the same day as DEXA  Labs:as above  Medications: as above    

## 2020-09-24 ENCOUNTER — Ambulatory Visit: Payer: Commercial Managed Care - PPO | Admitting: Obstetrics and Gynecology

## 2020-09-24 ENCOUNTER — Ambulatory Visit: Payer: Commercial Managed Care - PPO | Admitting: Certified Nurse Midwife

## 2020-09-25 ENCOUNTER — Ambulatory Visit (INDEPENDENT_AMBULATORY_CARE_PROVIDER_SITE_OTHER): Payer: 59 | Admitting: Nurse Practitioner

## 2020-09-25 ENCOUNTER — Other Ambulatory Visit: Payer: Self-pay

## 2020-09-25 ENCOUNTER — Ambulatory Visit: Payer: Commercial Managed Care - PPO | Admitting: Nurse Practitioner

## 2020-09-25 ENCOUNTER — Encounter: Payer: Self-pay | Admitting: Nurse Practitioner

## 2020-09-25 ENCOUNTER — Telehealth: Payer: Self-pay | Admitting: *Deleted

## 2020-09-25 VITALS — BP 130/80 | HR 68 | Resp 16 | Ht 63.5 in | Wt 120.0 lb

## 2020-09-25 DIAGNOSIS — M8580 Other specified disorders of bone density and structure, unspecified site: Secondary | ICD-10-CM | POA: Diagnosis not present

## 2020-09-25 DIAGNOSIS — Z01419 Encounter for gynecological examination (general) (routine) without abnormal findings: Secondary | ICD-10-CM

## 2020-09-25 DIAGNOSIS — B002 Herpesviral gingivostomatitis and pharyngotonsillitis: Secondary | ICD-10-CM

## 2020-09-25 DIAGNOSIS — F418 Other specified anxiety disorders: Secondary | ICD-10-CM | POA: Diagnosis not present

## 2020-09-25 DIAGNOSIS — R011 Cardiac murmur, unspecified: Secondary | ICD-10-CM

## 2020-09-25 MED ORDER — FLUOXETINE HCL 10 MG PO CAPS: ORAL_CAPSULE | ORAL | 12 refills | Status: AC

## 2020-09-25 MED ORDER — FLUOXETINE HCL 20 MG PO CAPS: ORAL_CAPSULE | ORAL | 12 refills | Status: AC

## 2020-09-25 MED ORDER — VALACYCLOVIR HCL 1 G PO TABS: 1000.0000 mg | ORAL_TABLET | Freq: Two times a day (BID) | ORAL | 3 refills | Status: AC

## 2020-09-25 NOTE — Telephone Encounter (Signed)
-----   Message from Clarita Crane, NP sent at 09/25/2020  2:05 PM EST ----- Please schedule cardiology referral ([address]) first available provider for evaluation of heart murmur. Pt is asymptomatic and has known she had a murmur but has been more than 10 years since evaluation

## 2020-09-25 NOTE — Telephone Encounter (Signed)
Kelly placed referral at [facility] location they will call to schedule.

## 2020-09-25 NOTE — Patient Instructions (Addendum)

## 2020-09-26 LAB — CBC
HCT: 40.2 % (ref 35.0–45.0)
Hemoglobin: 13.4 g/dL (ref 11.7–15.5)
MCH: 30.5 pg (ref 27.0–33.0)
MCHC: 33.3 g/dL (ref 32.0–36.0)
MCV: 91.6 fL (ref 80.0–100.0)
MPV: 10.6 fL (ref 7.5–12.5)
Platelets: 272 10*3/uL (ref 140–400)
RBC: 4.39 10*6/uL (ref 3.80–5.10)
RDW: 11.8 % (ref 11.0–15.0)
WBC: 8.8 10*3/uL (ref 3.8–10.8)

## 2020-09-26 LAB — LIPID PANEL
Cholesterol: 216 mg/dL — ABNORMAL HIGH (ref <200)
HDL: 56 mg/dL (ref >=50)
LDL Cholesterol (Calc): 131 mg/dL (calc) — ABNORMAL HIGH
Non-HDL Cholesterol (Calc): 160 mg/dL (calc) — ABNORMAL HIGH (ref <130)
Total CHOL/HDL Ratio: 3.9 (calc) (ref <5.0)
Triglycerides: 159 mg/dL — ABNORMAL HIGH (ref <150)

## 2020-09-26 LAB — COMPREHENSIVE METABOLIC PANEL
AG Ratio: 1.6 (calc) (ref 1.0–2.5)
ALT: 13 U/L (ref 6–29)
AST: 18 U/L (ref 10–35)
Albumin: 3.9 g/dL (ref 3.6–5.1)
Alkaline phosphatase (APISO): 63 U/L (ref 37–153)
BUN: 12 mg/dL (ref 7–25)
CO2: 27 mmol/L (ref 20–32)
Calcium: 8.8 mg/dL (ref 8.6–10.4)
Chloride: 103 mmol/L (ref 98–110)
Creat: 0.63 mg/dL (ref 0.50–0.99)
Globulin: 2.5 g/dL (calc) (ref 1.9–3.7)
Glucose, Bld: 87 mg/dL (ref 65–99)
Potassium: 3.8 mmol/L (ref 3.5–5.3)
Sodium: 139 mmol/L (ref 135–146)
Total Bilirubin: 0.2 mg/dL (ref 0.2–1.2)
Total Protein: 6.4 g/dL (ref 6.1–8.1)

## 2020-09-30 NOTE — Telephone Encounter (Signed)
Patient scheduled on 10/08/20 with Meriam Sprague, MD

## 2020-10-05 NOTE — Progress Notes (Signed)
Cardiology Office Note:    Date:  10/08/2020   ID:  April Spears, DOB Oct 30, 1955, MRN [id number]  PCP:  Patient, No Pcp Per  [facility] Cardiologist:  No primary care provider on file.  [facility] Electrophysiologist:  None   Referring MD: Clarita Crane, NP    History of Present Illness:    April Spears is a 65 y.o. female with a hx of known LBBB, osteopenia and depression who was referred by Clarita Crane, NP for further evaluation of a heart murmur.  The patient states that she overall feels well. She is not very active since the weather has been cold but is able to walk a flight of stairs, clean the house, and walk a mile without issues.No exertional chest pain, SOB, lightheadedness or palpitations. No orthopnea, PND, LE edema, or snoring. No personal history of CAD and no significant family history of cardiac disease. She has been told since a young age that she has a murmur and a LBBB on ECG.   Past Medical History:  Diagnosis Date  . History of heart murmur in childhood    She is not sure how long she has it, but was aware  . Osteopenia   . Renal calculi   . Situational depression     Past Surgical History:  Procedure Laterality Date  . TUBAL LIGATION Bilateral 1991    Current Medications: Current Meds  Medication Sig  . FLUoxetine (PROZAC) 10 MG capsule TAKE 1 CAPSULE BY MOUTH ONCE DAILY ALONG WITH 20 MG  . FLUoxetine (PROZAC) 20 MG capsule TAKE 1 CAPSULE BY MOUTH ONCE DAILY ALONG WITH 10 MG  . valACYclovir (VALTREX) 1000 MG tablet Take 1 tablet (1,000 mg total) by mouth 2 (two) times daily. X 24 hours  . VITAMIN D PO Take by mouth.     Allergies:   Patient has no known allergies.   Social History   Socioeconomic History  . Marital status: Married    Spouse name: Not on file  . Number of children: 1  . Years of education: Not on file  . Highest education level: Not on file  Occupational History  . Not on file  Tobacco Use  . Smoking status: Never  Smoker  . Smokeless tobacco: Never Used  Substance and Sexual Activity  . Alcohol use: Yes    Alcohol/week: 6.0 standard drinks    Types: 6 Standard drinks or equivalent per week  . Drug use: No  . Sexual activity: Yes    Partners: Male    Birth control/protection: Surgical    Comment: BTL  Other Topics Concern  . Not on file  Social History Narrative  . Not on file   Social Determinants of Health   Financial Resource Strain: Not on file  Food Insecurity: Not on file  Transportation Needs: Not on file  Physical Activity: Not on file  Stress: Not on file  Social Connections: Not on file     Family History: The patient's family history includes Bone cancer in her mother; Diabetes in her father and sister; Hyperlipidemia in her father; Hypertension in her father; Thyroid disease in her mother.  ROS:   Please see the history of present illness.    Review of Systems  Constitutional: Negative for chills and fever.  HENT: Negative for nosebleeds.   Eyes: Negative for blurred vision and redness.  Respiratory: Negative for shortness of breath.   Cardiovascular: Negative for chest pain, palpitations, orthopnea, claudication, leg swelling and PND.  Gastrointestinal: Negative for nausea and vomiting.  Genitourinary: Negative for dysuria.  Musculoskeletal: Negative for falls.  Neurological: Negative for dizziness and loss of consciousness.  Endo/Heme/Allergies: Negative for polydipsia.  Psychiatric/Behavioral: Negative for substance abuse.    EKGs/Labs/Other Studies Reviewed:    The following studies were reviewed today: No cardiac studies  EKG:  EKG is  ordered today.  The ekg ordered today demonstrates NSR with HR 98. LBBB  Recent Labs: 09/25/2020: ALT 13; BUN 12; Creat 0.63; Hemoglobin 13.4; Platelets 272; Potassium 3.8; Sodium 139  Recent Lipid Panel    Component Value Date/Time   CHOL 216 (H) 09/25/2020 1425   CHOL 199 09/21/2019 1005   TRIG 159 (H) 09/25/2020 1425    HDL 56 09/25/2020 1425   HDL 72 09/21/2019 1005   CHOLHDL 3.9 09/25/2020 1425   VLDL 16 07/28/2016 1443   LDLCALC 131 (H) 09/25/2020 1425     Physical Exam:    VS:  BP 140/80   Pulse 98   Ht 5\' 3"  (1.6 m)   Wt 118 lb 9.6 oz (53.8 kg)   LMP 05/09/2006 (Exact Date)   SpO2 99%   BMI 21.01 kg/m     Wt Readings from Last 3 Encounters:  10/08/20 118 lb 9.6 oz (53.8 kg)  09/25/20 120 lb (54.4 kg)  09/21/19 117 lb (53.1 kg)     GEN:  Well nourished, well developed in no acute distress HEENT: Normal NECK: No JVD; No carotid bruits CARDIAC: RRR, 2/6 systolic murmur best heard at RUSB, rubs, gallops RESPIRATORY:  Clear to auscultation without rales, wheezing or rhonchi  ABDOMEN: Soft, non-tender, non-distended MUSCULOSKELETAL:  No edema; No deformity  SKIN: Warm and dry NEUROLOGIC:  Alert and oriented x 3 PSYCHIATRIC:  Normal affect   ASSESSMENT:    1. Murmur   2. Complete left bundle branch block (LBBB)    PLAN:    In order of problems listed above:  #Heart Murmur: Patient with history of heart murmur since she was young. Has not had TTE before. Fortunately, she is asymptomatic with no chest pain, lightheadedness, SOB, palpitations, or HF symptoms. No exertional symptoms. Will check TTE for further evaluation -Check TTE  #Chronic LBBB: Has been told that she has had a LBBB since a young age. Again, no cardiac symptoms.  -Check TTE as above  #Elevated Blood Pressure: Patient with SBP in 140s today. States this happens when she goes to the doctor but is well controlled at home. Encourage her to continue to monitor closely at home with goal Bps 120s/80s. -Continue to monitor closely at home with goal 120s/80s -Lifestyle modifications as below -Follow-up with PCP as scheduled  #CV risk factor modification #Elevated LDL: Discussed healthy diet and exercise at length today as detailed below. Also recommended red yeast rice to help lower her cholesterol in addition to  the dietary and exercise modifications detailed.  Exercise recommendations: Goal of exercising for at least 30 minutes a day, at least 5 times per week.  Please exercise to a moderate exertion.  This means that while exercising it is difficult to speak in full sentences, however you are not so short of breath that you feel you must stop, and not so comfortable that you can carry on a full conversation.  Exertion level should be approximately a 5/10, if 10 is the most exertion you can perform.  Diet recommendations: Recommend a heart healthy diet such as the Mediterranean diet.  This diet consists of plant based foods, healthy fats, lean  meats, olive oil.  It suggests limiting the intake of simple carbohydrates such as white breads, pastries, and pastas.  It also limits the amount of red meat, wine, and dairy products such as cheese that one should consume on a daily basis.   Medication Adjustments/Labs and Tests Ordered: Current medicines are reviewed at length with the patient today.  Concerns regarding medicines are outlined above.  Orders Placed This Encounter  Procedures  . EKG 12-Lead  . ECHOCARDIOGRAM COMPLETE   No orders of the defined types were placed in this encounter.   Patient Instructions   Medication Instructions:  Your physician recommends that you continue on your current medications as directed. Please refer to the Current Medication list given to you today.  *If you need a refill on your cardiac medications before your next appointment, please call your pharmacy*   Lab Work: NONE If you have labs (blood work) drawn today and your tests are completely normal, you will receive your results only by: Marland Kitchen MyChart Message (if you have MyChart) OR . A paper copy in the mail If you have any lab test that is abnormal or we need to change your treatment, we will call you to review the results.   Testing/Procedures: Your physician has requested that you have an  echocardiogram. Echocardiography is a painless test that uses sound waves to create images of your heart. It provides your doctor with information about the size and shape of your heart and how well your heart's chambers and valves are working. This procedure takes approximately one hour. There are no restrictions for this procedure.  Follow-Up: At [facility], you and your health needs are our priority.  As part of our continuing mission to provide you with exceptional heart care, we have created designated Provider Care Teams.  These Care Teams include your primary Cardiologist (physician) and Advanced Practice Providers (APPs -  Physician Assistants and Nurse Practitioners) who all work together to provide you with the care you need, when you need it.  We recommend signing up for the patient portal called "MyChart".  Sign up information is provided on this After Visit Summary.  MyChart is used to connect with patients for Virtual Visits (Telemedicine).  Patients are able to view lab/test results, encounter notes, upcoming appointments, etc.  Non-urgent messages can be sent to your provider as well.   To learn more about what you can do with MyChart, go to [email or web address].    Your next appointment:   1 year(s)  The format for your next appointment:   In Person  Provider:   You may see DR. Devrin Monforte or one of the following Advanced Practice Providers on your designated Care Team:    Tereso Newcomer, PA-C  Vin [place], [place]    Other Instructions  Echocardiogram An echocardiogram is a test that uses sound waves (ultrasound) to produce images of the heart. Images from an echocardiogram can provide important information about:  Heart size and shape.  The size and thickness and movement of your heart's walls.  Heart muscle function and strength.  Heart valve function or if you have stenosis. Stenosis is when the heart valves are too narrow.  If blood is flowing backward  through the heart valves (regurgitation).  A tumor or infectious growth around the heart valves.  Areas of heart muscle that are not working well because of poor blood flow or injury from a heart attack.  Aneurysm detection. An aneurysm is a weak or damaged part of an  artery wall. The wall bulges out from the normal force of blood pumping through the body. Tell a health care provider about:  Any allergies you have.  All medicines you are taking, including vitamins, herbs, eye drops, creams, and over-the-counter medicines.  Any blood disorders you have.  Any surgeries you have had.  Any medical conditions you have.  Whether you are pregnant or may be pregnant. What are the risks? Generally, this is a safe test. However, problems may occur, including an allergic reaction to dye (contrast) that may be used during the test. What happens before the test? No specific preparation is needed. You may eat and drink normally. What happens during the test?  You will take off your clothes from the waist up and put on a hospital gown.  Electrodes or electrocardiogram (ECG)patches may be placed on your chest. The electrodes or patches are then connected to a device that monitors your heart rate and rhythm.  You will lie down on a table for an ultrasound exam. A gel will be applied to your chest to help sound waves pass through your skin.  A handheld device, called a transducer, will be pressed against your chest and moved over your heart. The transducer produces sound waves that travel to your heart and bounce back (or "echo" back) to the transducer. These sound waves will be captured in real-time and changed into images of your heart that can be viewed on a video monitor. The images will be recorded on a computer and reviewed by your health care provider.  You may be asked to change positions or hold your breath for a short time. This makes it easier to get different views or better views of your  heart.  In some cases, you may receive contrast through an IV in one of your veins. This can improve the quality of the pictures from your heart. The procedure may vary among health care providers and hospitals.   What can I expect after the test? You may return to your normal, everyday life, including diet, activities, and medicines, unless your health care provider tells you not to do that. Follow these instructions at home:  It is up to you to get the results of your test. Ask your health care provider, or the department that is doing the test, when your results will be ready.  Keep all follow-up visits. This is important. Summary  An echocardiogram is a test that uses sound waves (ultrasound) to produce images of the heart.  Images from an echocardiogram can provide important information about the size and shape of your heart, heart muscle function, heart valve function, and other possible heart problems.  You do not need to do anything to prepare before this test. You may eat and drink normally.  After the echocardiogram is completed, you may return to your normal, everyday life, unless your health care provider tells you not to do that. This information is not intended to replace advice given to you by your health care provider. Make sure you discuss any questions you have with your health care provider. Document Revised: 04/15/2020 Document Reviewed: 04/15/2020 Elsevier Patient Education  2021 Elsevier Inc.   Mediterranean Diet A Mediterranean diet refers to food and lifestyle choices that are based on the traditions of countries located on the [place]. This way of eating has been shown to help prevent certain conditions and improve outcomes for people who have chronic diseases, like kidney disease and heart disease. What are tips  for following this plan? Lifestyle  Cook and eat meals together with your family, when possible.  Drink enough fluid to keep your urine  clear or pale yellow.  Be physically active every day. This includes: ? Aerobic exercise like running or swimming. ? Leisure activities like gardening, walking, or housework.  Get 7-8 hours of sleep each night.  If recommended by your health care provider, drink red wine in moderation. This means 1 glass a day for nonpregnant women and 2 glasses a day for men. A glass of wine equals 5 oz (150 mL). Reading food labels  Check the serving size of packaged foods. For foods such as rice and pasta, the serving size refers to the amount of cooked product, not dry.  Check the total fat in packaged foods. Avoid foods that have saturated fat or trans fats.  Check the ingredients list for added sugars, such as corn syrup.   Shopping  At the grocery store, buy most of your food from the areas near the walls of the store. This includes: ? Fresh fruits and vegetables (produce). ? Grains, beans, nuts, and seeds. Some of these may be available in unpackaged forms or large amounts (in bulk). ? Fresh seafood. ? Poultry and eggs. ? Low-fat dairy products.  Buy whole ingredients instead of prepackaged foods.  Buy fresh fruits and vegetables in-season from local farmers markets.  Buy frozen fruits and vegetables in resealable bags.  If you do not have access to quality fresh seafood, buy precooked frozen shrimp or canned fish, such as tuna, salmon, or sardines.  Buy small amounts of raw or cooked vegetables, salads, or olives from the deli or salad bar at your store.  Stock your pantry so you always have certain foods on hand, such as olive oil, canned tuna, canned tomatoes, rice, pasta, and beans. Cooking  Cook foods with extra-virgin olive oil instead of using butter or other vegetable oils.  Have meat as a side dish, and have vegetables or grains as your main dish. This means having meat in small portions or adding small amounts of meat to foods like pasta or stew.  Use beans or vegetables  instead of meat in common dishes like chili or lasagna.  Experiment with different cooking methods. Try roasting or broiling vegetables instead of steaming or sauteing them.  Add frozen vegetables to soups, stews, pasta, or rice.  Add nuts or seeds for added healthy fat at each meal. You can add these to yogurt, salads, or vegetable dishes.  Marinate fish or vegetables using olive oil, lemon juice, garlic, and fresh herbs. Meal planning  Plan to eat 1 vegetarian meal one day each week. Try to work up to 2 vegetarian meals, if possible.  Eat seafood 2 or more times a week.  Have healthy snacks readily available, such as: ? Vegetable sticks with hummus. ? [place] yogurt. ? Fruit and nut trail mix.  Eat balanced meals throughout the week. This includes: ? Fruit: 2-3 servings a day ? Vegetables: 4-5 servings a day ? Low-fat dairy: 2 servings a day ? Fish, poultry, or lean meat: 1 serving a day ? Beans and legumes: 2 or more servings a week ? Nuts and seeds: 1-2 servings a day ? Whole grains: 6-8 servings a day ? Extra-virgin olive oil: 3-4 servings a day  Limit red meat and sweets to only a few servings a month   What are my food choices?  Mediterranean diet ? Recommended  Grains: Whole-grain pasta. Manson Passey  rice. Bulgar wheat. Polenta. Couscous. Whole-wheat bread. Orpah Cobbatmeal. Quinoa.  Vegetables: Artichokes. Beets. Broccoli. Cabbage. Carrots. Eggplant. Green beans. Chard. Kale. Spinach. Onions. Leeks. Peas. Squash. Tomatoes. Peppers. Radishes.  Fruits: Apples. Apricots. Avocado. Berries. Bananas. Cherries. Dates. Figs. Grapes. Lemons. Melon. Oranges. Peaches. Plums. Pomegranate.  Meats and other protein foods: Beans. Almonds. Sunflower seeds. Pine nuts. Peanuts. Cod. Salmon. Scallops. Shrimp. Tuna. Tilapia. Clams. Oysters. Eggs.  Dairy: Low-fat milk. Cheese. Greek yogurt.  Beverages: Water. Red wine. Herbal tea.  Fats and oils: Extra virgin olive oil. Avocado oil. Grape seed  oil.  Sweets and desserts: [place] yogurt with honey. Baked apples. Poached pears. Trail mix.  Seasoning and other foods: Basil. Cilantro. Coriander. Cumin. Mint. Parsley. Sage. Rosemary. Tarragon. Garlic. Oregano. Thyme. Pepper. Balsalmic vinegar. Tahini. Hummus. Tomato sauce. Olives. Mushrooms. ? Limit these  Grains: Prepackaged pasta or rice dishes. Prepackaged cereal with added sugar.  Vegetables: Deep fried potatoes (french fries).  Fruits: Fruit canned in syrup.  Meats and other protein foods: Beef. Pork. Lamb. Poultry with skin. Hot dogs. Tomasa BlaseBacon.  Dairy: Ice cream. Sour cream. Whole milk.  Beverages: Juice. Sugar-sweetened soft drinks. Beer. Liquor and spirits.  Fats and oils: Butter. Canola oil. Vegetable oil. Beef fat (tallow). Lard.  Sweets and desserts: Cookies. Cakes. Pies. Candy.  Seasoning and other foods: Mayonnaise. Premade sauces and marinades. The items listed may not be a complete list. Talk with your dietitian about what dietary choices are right for you. Summary  The Mediterranean diet includes both food and lifestyle choices.  Eat a variety of fresh fruits and vegetables, beans, nuts, seeds, and whole grains.  Limit the amount of red meat and sweets that you eat.  Talk with your health care provider about whether it is safe for you to drink red wine in moderation. This means 1 glass a day for nonpregnant women and 2 glasses a day for men. A glass of wine equals 5 oz (150 mL). This information is not intended to replace advice given to you by your health care provider. Make sure you discuss any questions you have with your health care provider. Document Revised: 04/22/2016 Document Reviewed: 04/15/2016 Elsevier Patient Education  2020 [employer]       Signed, Meriam SpragueHeather E Klani Caridi, MD  10/08/2020 8:54 AM    [facility]

## 2020-10-08 ENCOUNTER — Encounter: Payer: Self-pay | Admitting: Cardiology

## 2020-10-08 ENCOUNTER — Other Ambulatory Visit: Payer: Self-pay

## 2020-10-08 ENCOUNTER — Ambulatory Visit (INDEPENDENT_AMBULATORY_CARE_PROVIDER_SITE_OTHER): Payer: 59 | Admitting: Cardiology

## 2020-10-08 VITALS — BP 140/80 | HR 98 | Ht 63.0 in | Wt 118.6 lb

## 2020-10-08 DIAGNOSIS — R030 Elevated blood-pressure reading, without diagnosis of hypertension: Secondary | ICD-10-CM | POA: Diagnosis not present

## 2020-10-08 DIAGNOSIS — R011 Cardiac murmur, unspecified: Secondary | ICD-10-CM | POA: Diagnosis not present

## 2020-10-08 DIAGNOSIS — I447 Left bundle-branch block, unspecified: Secondary | ICD-10-CM | POA: Diagnosis not present

## 2020-10-08 NOTE — Patient Instructions (Signed)
Medication Instructions:  Your physician recommends that you continue on your current medications as directed. Please refer to the Current Medication list given to you today.  *If you need a refill on your cardiac medications before your next appointment, please call your pharmacy*   Lab Work: NONE If you have labs (blood work) drawn today and your tests are completely normal, you will receive your results only by: Marland Kitchen MyChart Message (if you have MyChart) OR . A paper copy in the mail If you have any lab test that is abnormal or we need to change your treatment, we will call you to review the results.   Testing/Procedures: Your physician has requested that you have an echocardiogram. Echocardiography is a painless test that uses sound waves to create images of your heart. It provides your doctor with information about the size and shape of your heart and how well your heart's chambers and valves are working. This procedure takes approximately one hour. There are no restrictions for this procedure.  Follow-Up: At [facility], you and your health needs are our priority.  As part of our continuing mission to provide you with exceptional heart care, we have created designated Provider Care Teams.  These Care Teams include your primary Cardiologist (physician) and Advanced Practice Providers (APPs -  Physician Assistants and Nurse Practitioners) who all work together to provide you with the care you need, when you need it.  We recommend signing up for the patient portal called "MyChart".  Sign up information is provided on this After Visit Summary.  MyChart is used to connect with patients for Virtual Visits (Telemedicine).  Patients are able to view lab/test results, encounter notes, upcoming appointments, etc.  Non-urgent messages can be sent to your provider as well.   To learn more about what you can do with MyChart, go to [email or web address].    Your next appointment:   1 year(s)  The  format for your next appointment:   In Person  Provider:   You may see DR. PEMBERTON or one of the following Advanced Practice Providers on your designated Care Team:    Tereso Newcomer, PA-C  [place], [place]    Other Instructions  Echocardiogram An echocardiogram is a test that uses sound waves (ultrasound) to produce images of the heart. Images from an echocardiogram can provide important information about:  Heart size and shape.  The size and thickness and movement of your heart's walls.  Heart muscle function and strength.  Heart valve function or if you have stenosis. Stenosis is when the heart valves are too narrow.  If blood is flowing backward through the heart valves (regurgitation).  A tumor or infectious growth around the heart valves.  Areas of heart muscle that are not working well because of poor blood flow or injury from a heart attack.  Aneurysm detection. An aneurysm is a weak or damaged part of an artery wall. The wall bulges out from the normal force of blood pumping through the body. Tell a health care provider about:  Any allergies you have.  All medicines you are taking, including vitamins, herbs, eye drops, creams, and over-the-counter medicines.  Any blood disorders you have.  Any surgeries you have had.  Any medical conditions you have.  Whether you are pregnant or may be pregnant. What are the risks? Generally, this is a safe test. However, problems may occur, including an allergic reaction to dye (contrast) that may be used during the test. What happens before the test?  No specific preparation is needed. You may eat and drink normally. What happens during the test?  You will take off your clothes from the waist up and put on a hospital gown.  Electrodes or electrocardiogram (ECG)patches may be placed on your chest. The electrodes or patches are then connected to a device that monitors your heart rate and rhythm.  You will lie down on a  table for an ultrasound exam. A gel will be applied to your chest to help sound waves pass through your skin.  A handheld device, called a transducer, will be pressed against your chest and moved over your heart. The transducer produces sound waves that travel to your heart and bounce back (or "echo" back) to the transducer. These sound waves will be captured in real-time and changed into images of your heart that can be viewed on a video monitor. The images will be recorded on a computer and reviewed by your health care provider.  You may be asked to change positions or hold your breath for a short time. This makes it easier to get different views or better views of your heart.  In some cases, you may receive contrast through an IV in one of your veins. This can improve the quality of the pictures from your heart. The procedure may vary among health care providers and hospitals.   What can I expect after the test? You may return to your normal, everyday life, including diet, activities, and medicines, unless your health care provider tells you not to do that. Follow these instructions at home:  It is up to you to get the results of your test. Ask your health care provider, or the department that is doing the test, when your results will be ready.  Keep all follow-up visits. This is important. Summary  An echocardiogram is a test that uses sound waves (ultrasound) to produce images of the heart.  Images from an echocardiogram can provide important information about the size and shape of your heart, heart muscle function, heart valve function, and other possible heart problems.  You do not need to do anything to prepare before this test. You may eat and drink normally.  After the echocardiogram is completed, you may return to your normal, everyday life, unless your health care provider tells you not to do that. This information is not intended to replace advice given to you by your health care  provider. Make sure you discuss any questions you have with your health care provider. Document Revised: 04/15/2020 Document Reviewed: 04/15/2020 Elsevier Patient Education  2021 Elsevier Inc.   Mediterranean Diet A Mediterranean diet refers to food and lifestyle choices that are based on the traditions of countries located on the [place]. This way of eating has been shown to help prevent certain conditions and improve outcomes for people who have chronic diseases, like kidney disease and heart disease. What are tips for following this plan? Lifestyle  Cook and eat meals together with your family, when possible.  Drink enough fluid to keep your urine clear or pale yellow.  Be physically active every day. This includes: ? Aerobic exercise like running or swimming. ? Leisure activities like gardening, walking, or housework.  Get 7-8 hours of sleep each night.  If recommended by your health care provider, drink red wine in moderation. This means 1 glass a day for nonpregnant women and 2 glasses a day for men. A glass of wine equals 5 oz (150 mL). Reading food labels  Check the serving size of packaged foods. For foods such as rice and pasta, the serving size refers to the amount of cooked product, not dry.  Check the total fat in packaged foods. Avoid foods that have saturated fat or trans fats.  Check the ingredients list for added sugars, such as corn syrup.   Shopping  At the grocery store, buy most of your food from the areas near the walls of the store. This includes: ? Fresh fruits and vegetables (produce). ? Grains, beans, nuts, and seeds. Some of these may be available in unpackaged forms or large amounts (in bulk). ? Fresh seafood. ? Poultry and eggs. ? Low-fat dairy products.  Buy whole ingredients instead of prepackaged foods.  Buy fresh fruits and vegetables in-season from local farmers markets.  Buy frozen fruits and vegetables in resealable bags.  If  you do not have access to quality fresh seafood, buy precooked frozen shrimp or canned fish, such as tuna, salmon, or sardines.  Buy small amounts of raw or cooked vegetables, salads, or olives from the deli or salad bar at your store.  Stock your pantry so you always have certain foods on hand, such as olive oil, canned tuna, canned tomatoes, rice, pasta, and beans. Cooking  Cook foods with extra-virgin olive oil instead of using butter or other vegetable oils.  Have meat as a side dish, and have vegetables or grains as your main dish. This means having meat in small portions or adding small amounts of meat to foods like pasta or stew.  Use beans or vegetables instead of meat in common dishes like chili or lasagna.  Experiment with different cooking methods. Try roasting or broiling vegetables instead of steaming or sauteing them.  Add frozen vegetables to soups, stews, pasta, or rice.  Add nuts or seeds for added healthy fat at each meal. You can add these to yogurt, salads, or vegetable dishes.  Marinate fish or vegetables using olive oil, lemon juice, garlic, and fresh herbs. Meal planning  Plan to eat 1 vegetarian meal one day each week. Try to work up to 2 vegetarian meals, if possible.  Eat seafood 2 or more times a week.  Have healthy snacks readily available, such as: ? Vegetable sticks with hummus. ? [place] yogurt. ? Fruit and nut trail mix.  Eat balanced meals throughout the week. This includes: ? Fruit: 2-3 servings a day ? Vegetables: 4-5 servings a day ? Low-fat dairy: 2 servings a day ? Fish, poultry, or lean meat: 1 serving a day ? Beans and legumes: 2 or more servings a week ? Nuts and seeds: 1-2 servings a day ? Whole grains: 6-8 servings a day ? Extra-virgin olive oil: 3-4 servings a day  Limit red meat and sweets to only a few servings a month   What are my food choices?  Mediterranean diet ? Recommended  Grains: Whole-grain pasta. Brown rice. Bulgar  wheat. Polenta. Couscous. Whole-wheat bread. Orpah Cobb.  Vegetables: Artichokes. Beets. Broccoli. Cabbage. Carrots. Eggplant. Green beans. Chard. Kale. Spinach. Onions. Leeks. Peas. Squash. Tomatoes. Peppers. Radishes.  Fruits: Apples. Apricots. Avocado. Berries. Bananas. Cherries. Dates. Figs. Grapes. Lemons. Melon. Oranges. Peaches. Plums. Pomegranate.  Meats and other protein foods: Beans. Almonds. Sunflower seeds. Pine nuts. Peanuts. Cod. Salmon. Scallops. Shrimp. Tuna. Tilapia. Clams. Oysters. Eggs.  Dairy: Low-fat milk. Cheese. Greek yogurt.  Beverages: Water. Red wine. Herbal tea.  Fats and oils: Extra virgin olive oil. Avocado oil. Grape seed oil.  Sweets and desserts: [place] yogurt  with honey. Baked apples. Poached pears. Trail mix.  Seasoning and other foods: Basil. Cilantro. Coriander. Cumin. Mint. Parsley. Sage. Rosemary. Tarragon. Garlic. Oregano. Thyme. Pepper. Balsalmic vinegar. Tahini. Hummus. Tomato sauce. Olives. Mushrooms. ? Limit these  Grains: Prepackaged pasta or rice dishes. Prepackaged cereal with added sugar.  Vegetables: Deep fried potatoes (french fries).  Fruits: Fruit canned in syrup.  Meats and other protein foods: Beef. Pork. Lamb. Poultry with skin. Hot dogs. Tomasa Blase.  Dairy: Ice cream. Sour cream. Whole milk.  Beverages: Juice. Sugar-sweetened soft drinks. Beer. Liquor and spirits.  Fats and oils: Butter. Canola oil. Vegetable oil. Beef fat (tallow). Lard.  Sweets and desserts: Cookies. Cakes. Pies. Candy.  Seasoning and other foods: Mayonnaise. Premade sauces and marinades. The items listed may not be a complete list. Talk with your dietitian about what dietary choices are right for you. Summary  The Mediterranean diet includes both food and lifestyle choices.  Eat a variety of fresh fruits and vegetables, beans, nuts, seeds, and whole grains.  Limit the amount of red meat and sweets that you eat.  Talk with your health care provider  about whether it is safe for you to drink red wine in moderation. This means 1 glass a day for nonpregnant women and 2 glasses a day for men. A glass of wine equals 5 oz (150 mL). This information is not intended to replace advice given to you by your health care provider. Make sure you discuss any questions you have with your health care provider. Document Revised: 04/22/2016 Document Reviewed: 04/15/2016 Elsevier Patient Education  2020 [employer].

## 2020-10-22 LAB — COLOGUARD: Cologuard: NEGATIVE

## 2020-10-27 ENCOUNTER — Other Ambulatory Visit: Payer: Self-pay

## 2020-10-27 ENCOUNTER — Ambulatory Visit (HOSPITAL_COMMUNITY): Payer: 59 | Attending: Cardiovascular Disease

## 2020-10-27 DIAGNOSIS — R011 Cardiac murmur, unspecified: Secondary | ICD-10-CM | POA: Insufficient documentation

## 2020-10-27 LAB — ECHOCARDIOGRAM COMPLETE
Area-P 1/2: 5.54 cm2
S' Lateral: 2.8 cm

## 2020-10-28 ENCOUNTER — Telehealth: Payer: Self-pay | Admitting: *Deleted

## 2020-10-28 DIAGNOSIS — Z1211 Encounter for screening for malignant neoplasm of colon: Secondary | ICD-10-CM

## 2020-10-28 DIAGNOSIS — Z01419 Encounter for gynecological examination (general) (routine) without abnormal findings: Secondary | ICD-10-CM

## 2020-10-28 DIAGNOSIS — Z1212 Encounter for screening for malignant neoplasm of rectum: Secondary | ICD-10-CM

## 2020-10-28 NOTE — Telephone Encounter (Signed)
Call placed to patient.  Patient notified of negative cologuard results per Clarita Crane, NP.  Return call to office if any additional questions.  Results entered in Epic.   Routing to provider for final review. Patient is agreeable to disposition. Will close encounter.

## 2020-10-30 ENCOUNTER — Telehealth: Payer: Self-pay | Admitting: Cardiology

## 2020-10-30 NOTE — Telephone Encounter (Signed)
° °  Pt is returning call to get echo result °

## 2020-10-30 NOTE — Telephone Encounter (Signed)
Returned call to Pt.  Answered all questions regarding Echo.

## 2021-02-27 IMAGING — MG DIGITAL SCREENING BILATERAL MAMMOGRAM WITH TOMO AND CAD
8 series · 8 of 24 positions shown · non-contrast
Comparison: Previous exam(s).

CLINICAL DATA: Screening.

EXAM:
DIGITAL SCREENING BILATERAL MAMMOGRAM WITH TOMO AND CAD

[R MLO synth-2D]
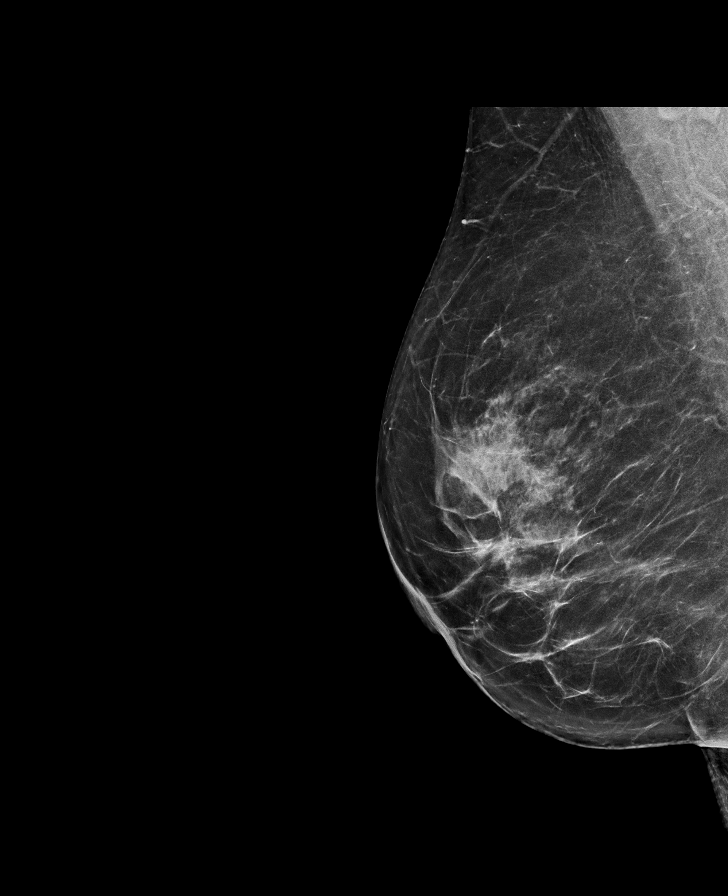

[L CC synth-2D]
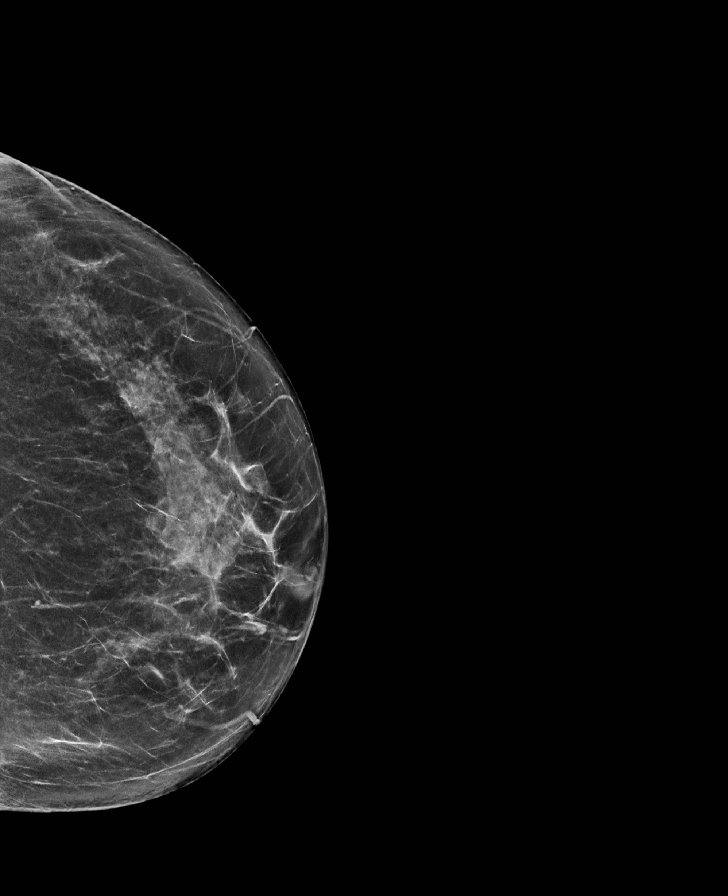

[R CC synth-2D]
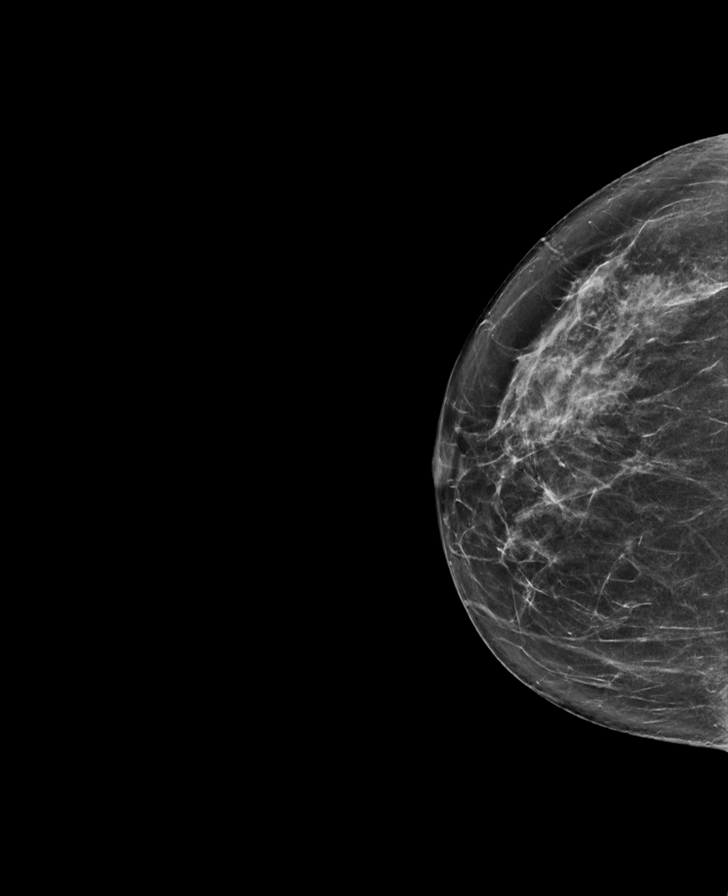

[L MLO synth-2D]
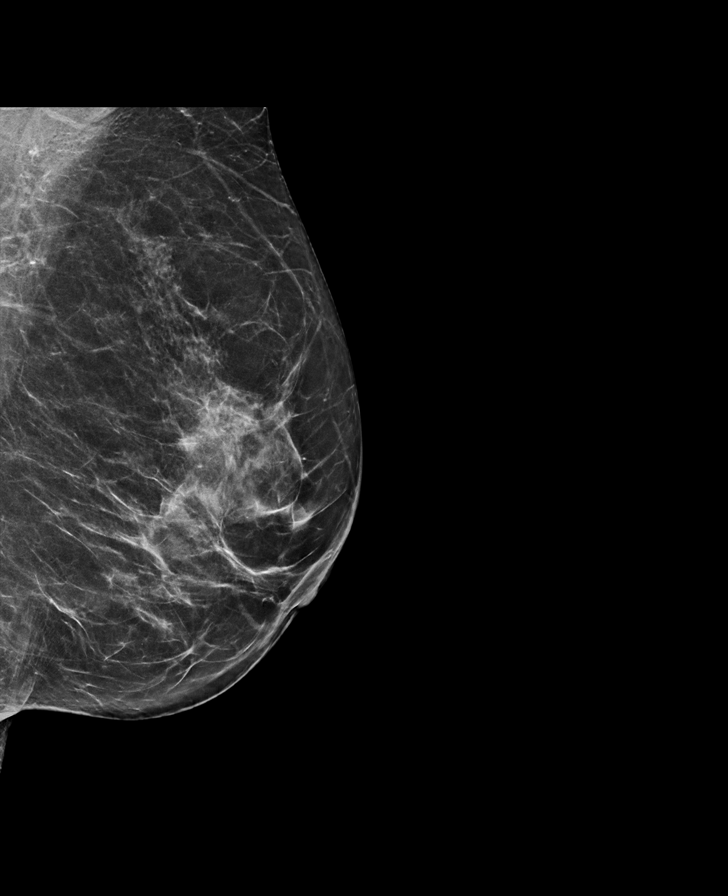

[L CC tomo · tomo slice 37/72.0]
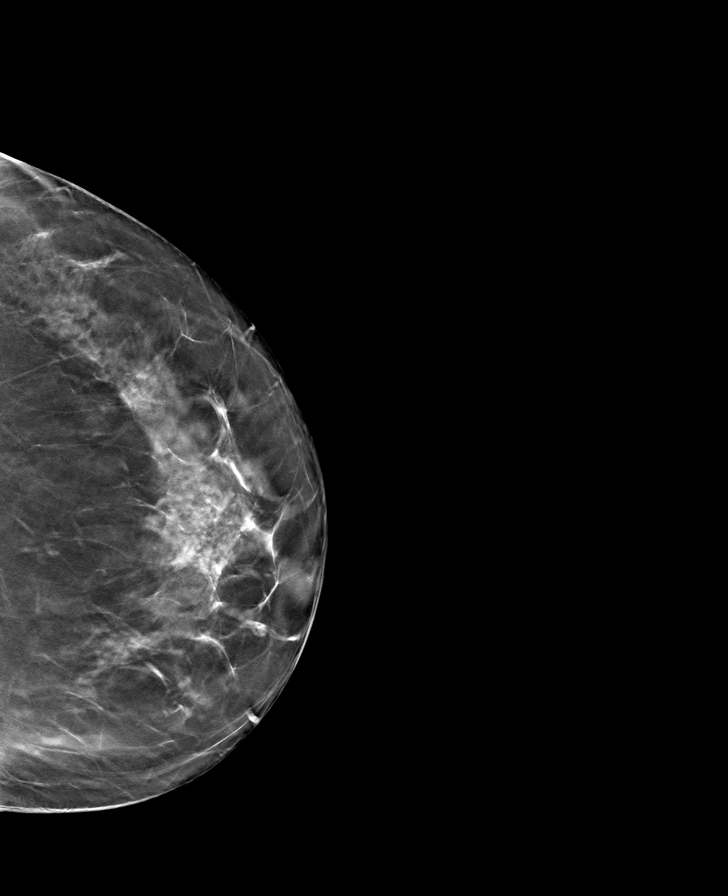

[R MLO tomo · tomo slice 37/74.0]
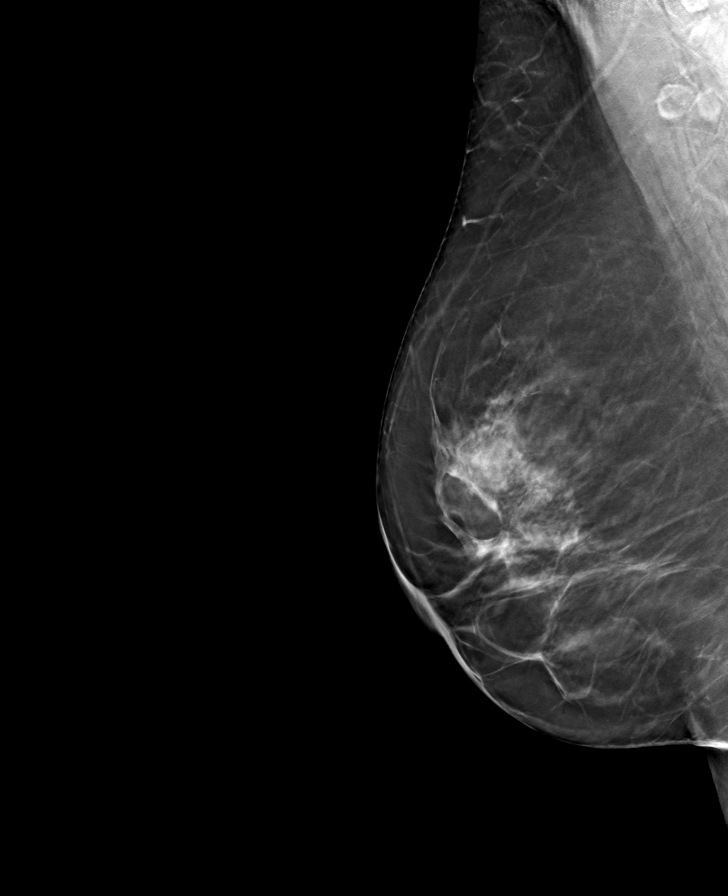

[R CC tomo · tomo slice 37/72.0]
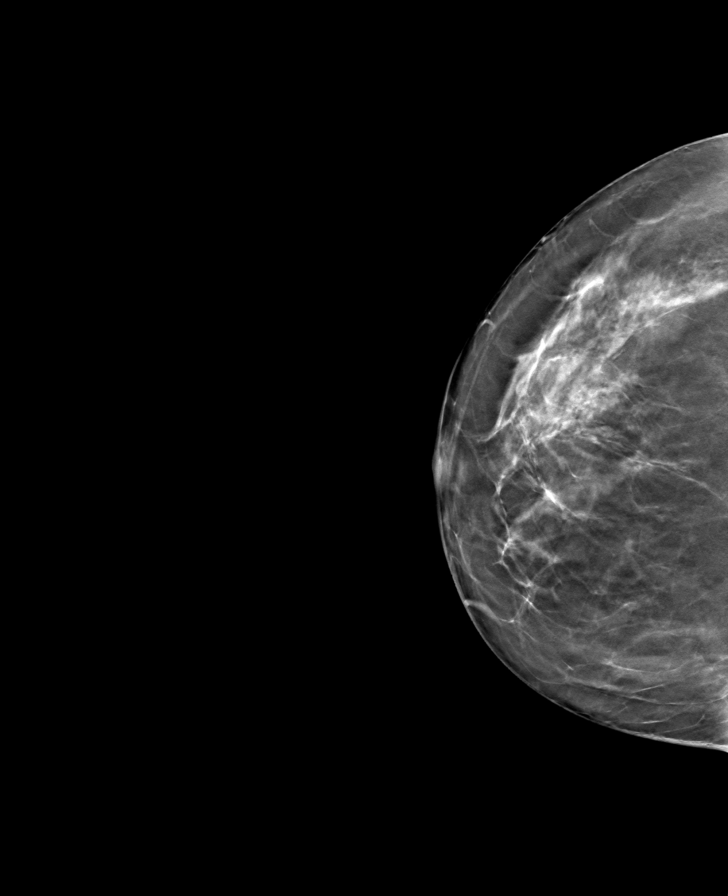

[L MLO tomo · tomo slice 37/72.0]
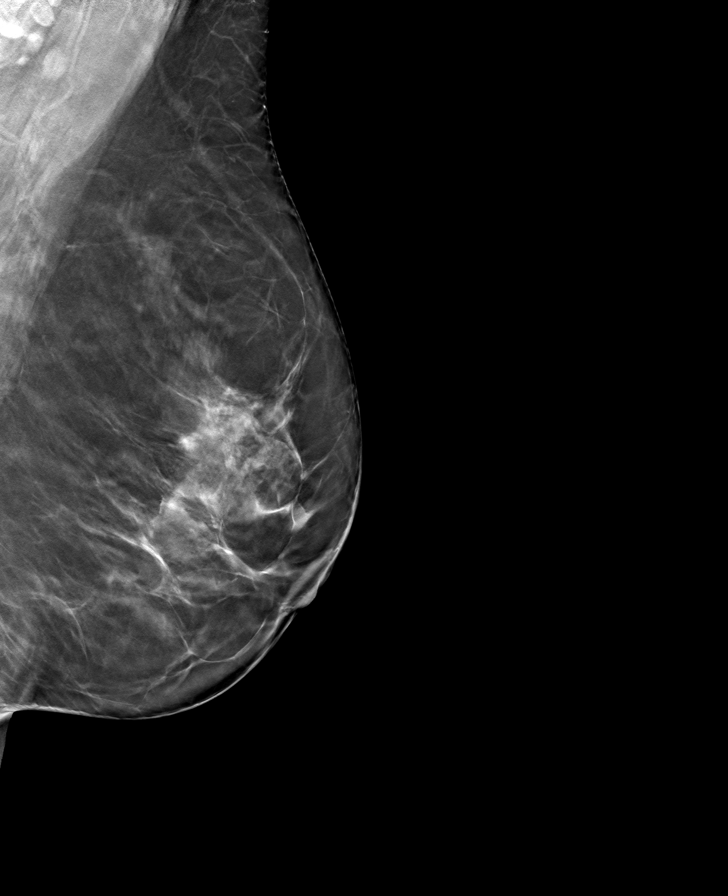

[8 of 24 positions shown; findings below may reference images not displayed]

ACR Breast Density Category c: The breast tissue is heterogeneously
dense, which may obscure small masses.
FINDINGS: There are no findings suspicious for malignancy. Images were
processed with CAD.
IMPRESSION: No mammographic evidence of malignancy. A result letter of this
screening mammogram will be mailed directly to the patient.

RECOMMENDATION:
Screening mammogram in one year. (Code:[id number])

BI-RADS CATEGORY  1: Negative.

## 2021-10-27 NOTE — Progress Notes (Signed)
° °  April Spears 19-Dec-1955 [id number]   History: Postmenopausal 66 y.o. presents for annual exam. C/o vaginal dryness and dyspareunia.   Gynecologic History Postmenopausal Last Pap: 2020. Results were: normal Last mammogram: 2020. Results were: normal Last colonoscopy: 2008, cologuard 2022 HRT use: no DEXA 2020 osteopenia  Obstetric History OB History  Gravida Para Term Preterm AB Living  1 1 0 1 0 1  SAB IAB Ectopic Multiple Live Births  0 0 0 0 1    # Outcome Date GA Lbr Len/2nd Weight Sex Delivery Anes PTL Lv  1 Preterm 1977 [redacted]w[redacted]d   F    LIV     The following portions of the patient's history were reviewed and updated as appropriate: allergies, current medications, past family history, past medical history, past social history, past surgical history, and problem list.  Review of Systems Pertinent items noted in HPI and remainder of comprehensive ROS otherwise negative.  Past medical history, past surgical history, family history and social history were all reviewed and documented in the EPIC chart.  Exam:  Vitals:   10/28/21 1319  BP: (!) 148/90  Weight: 119 lb (54 kg)  Height: 5' 3.25" (1.607 m)   Body mass index is 20.91 kg/m.  General appearance:  Normal Thyroid:  Symmetrical, normal in size, without palpable masses or nodularity. Respiratory  Auscultation:  Clear without wheezing or rhonchi Cardiovascular  Auscultation:  Regular rate, without rubs, murmurs or gallops  Edema/varicosities:  Not grossly evident Abdominal  Soft,nontender, without masses, guarding or rebound.  Liver/spleen:  No organomegaly noted  Hernia:  None appreciated  Skin  Inspection:  Grossly normal Breasts: Examined lying and sitting.   Right: Without masses, retractions, nipple discharge or axillary adenopathy.   Left: Without masses, retractions, nipple discharge or axillary adenopathy. Genitourinary   Inguinal/mons:  Normal without inguinal adenopathy  External genitalia:   Normal appearing vulva with no masses, tenderness, or lesions  BUS/Urethra/Skene's glands:  Normal  Vagina:  Normal appearing with normal color and discharge, no lesions. Atrophy: moderate   Cervix:  Normal appearing without discharge or lesions  Uterus:  Normal in size, shape and contour.  Midline and mobile, nontender  Adnexa/parametria:     Rt: Normal in size, without masses or tenderness.   Lt: Normal in size, without masses or tenderness.  Anus and perineum: Normal  Digital rectal exam: Normal sphincter tone without palpated masses or tenderness  Patient informed chaperone available to be present for breast and pelvic exam. Patient has requested no chaperone to be present. Patient has been advised what will be completed during breast and pelvic exam.   Assessment/Plan:   - Well woman exam -Pap with cotesting, if neg may d/c paps -Vaginal atrophy- begin yuvafem twice weekly at bedtime, Coconut oil may be used as a lubricant -Mammo - Osteopenia on DEXA 2020, needs repeat -Colonoscopy/cologuard up to date  Discussed SBE, colonoscopy and DEXA screening as directed. Recommend of exercise weekly, including weight bearing exercise. Encouraged the use of seatbelts and sunscreen.  Return in 1 year for annual or sooner prn.  Shaunae Sieloff B WHNP-BC, 1:31 PM 10/28/2021

## 2021-10-28 ENCOUNTER — Encounter: Payer: Self-pay | Admitting: Radiology

## 2021-10-28 ENCOUNTER — Other Ambulatory Visit (HOSPITAL_COMMUNITY)
Admission: RE | Admit: 2021-10-28 | Discharge: 2021-10-28 | Disposition: A | Discharge status: Home / Self Care | Payer: Medicare Other | Source: Ambulatory Visit | Attending: Radiology | Admitting: Radiology

## 2021-10-28 ENCOUNTER — Other Ambulatory Visit: Payer: Self-pay

## 2021-10-28 ENCOUNTER — Ambulatory Visit (INDEPENDENT_AMBULATORY_CARE_PROVIDER_SITE_OTHER): Payer: Medicare Other | Admitting: Radiology

## 2021-10-28 VITALS — BP 148/90 | Ht 63.25 in | Wt 119.0 lb

## 2021-10-28 DIAGNOSIS — Z780 Asymptomatic menopausal state: Secondary | ICD-10-CM

## 2021-10-28 DIAGNOSIS — M8580 Other specified disorders of bone density and structure, unspecified site: Secondary | ICD-10-CM | POA: Diagnosis not present

## 2021-10-28 DIAGNOSIS — Z01419 Encounter for gynecological examination (general) (routine) without abnormal findings: Secondary | ICD-10-CM | POA: Insufficient documentation

## 2021-10-28 DIAGNOSIS — N941 Unspecified dyspareunia: Secondary | ICD-10-CM | POA: Diagnosis not present

## 2021-10-28 DIAGNOSIS — Z9189 Other specified personal risk factors, not elsewhere classified: Secondary | ICD-10-CM

## 2021-10-28 DIAGNOSIS — N952 Postmenopausal atrophic vaginitis: Secondary | ICD-10-CM

## 2021-10-28 DIAGNOSIS — Z1151 Encounter for screening for human papillomavirus (HPV): Secondary | ICD-10-CM | POA: Diagnosis not present

## 2021-10-28 DIAGNOSIS — Z124 Encounter for screening for malignant neoplasm of cervix: Secondary | ICD-10-CM | POA: Diagnosis not present

## 2021-10-28 MED ORDER — ESTRADIOL 10 MCG VA TABS: 1.0000 | ORAL_TABLET | VAGINAL | 11 refills | Status: AC

## 2021-10-30 LAB — CYTOLOGY - PAP
Comment: NEGATIVE
Diagnosis: NEGATIVE
High risk HPV: NEGATIVE

## 2021-11-04 ENCOUNTER — Ambulatory Visit (INDEPENDENT_AMBULATORY_CARE_PROVIDER_SITE_OTHER): Payer: Medicare Other

## 2021-11-04 ENCOUNTER — Other Ambulatory Visit: Payer: Self-pay | Admitting: Radiology

## 2021-11-04 ENCOUNTER — Other Ambulatory Visit: Payer: Self-pay

## 2021-11-04 DIAGNOSIS — M810 Age-related osteoporosis without current pathological fracture: Secondary | ICD-10-CM | POA: Diagnosis not present

## 2021-11-04 DIAGNOSIS — Z780 Asymptomatic menopausal state: Secondary | ICD-10-CM

## 2021-11-12 ENCOUNTER — Encounter: Payer: Self-pay | Admitting: Obstetrics & Gynecology

## 2021-11-12 ENCOUNTER — Other Ambulatory Visit: Payer: Self-pay

## 2021-11-12 ENCOUNTER — Ambulatory Visit (INDEPENDENT_AMBULATORY_CARE_PROVIDER_SITE_OTHER): Payer: Medicare Other | Admitting: Obstetrics & Gynecology

## 2021-11-12 VITALS — BP 120/70

## 2021-11-12 DIAGNOSIS — M810 Age-related osteoporosis without current pathological fracture: Secondary | ICD-10-CM | POA: Diagnosis not present

## 2021-11-12 DIAGNOSIS — Z9189 Other specified personal risk factors, not elsewhere classified: Secondary | ICD-10-CM | POA: Diagnosis not present

## 2021-11-12 MED ORDER — ALENDRONATE SODIUM 70 MG PO TABS: 70.0000 mg | ORAL_TABLET | ORAL | 4 refills | Status: AC

## 2021-11-12 NOTE — Progress Notes (Signed)
? ? ?  SAKOYA WIN Sep 27, 1955 [id number] ? ? ?     66 y.o.  G1P0101 Just retired. ? ?RP: Counseling and management of Osteoporosis ? ?HPI: BD 11/2021 Osteoporosis T-Score -2.5 at the Left Femoral Neck.  Taking Vit D and Ca++ supplement.  Not exercising regularly.  No balance issue.  No recent fall.  In the process of getting a tooth implant in 01/2022. ? ? ?OB History  ?Gravida Para Term Preterm AB Living  ?1 1 0 1 0 1  ?SAB IAB Ectopic Multiple Live Births  ?0 0 0 0 1  ?  ?# Outcome Date GA Lbr Len/2nd Weight Sex Delivery Anes PTL Lv  ?1 Preterm 1977 [redacted]w[redacted]d   F    LIV  ? ? ?Past medical history,surgical history, problem list, medications, allergies, family history and social history were all reviewed and documented in the EPIC chart. ? ? ?Directed ROS with pertinent positives and negatives documented in the history of present illness/assessment and plan. ? ?Exam: ? ?Vitals:  ? 11/12/21 1034  ?BP: 120/70  ? ?General appearance:  Normal ? ?Bone Density 11/04/2021: Osteoporosis with T-Score at -2.5 at the Left Femoral Neck.  All other sites with Osteopenia. ? ? ?Assessment/Plan:  66 y.o. G1P0101  ? ?1. Age-related osteoporosis without current pathological fracture ?BD 11/2021 Osteoporosis T-Score -2.5 at the Left Femoral Neck.  Taking Vit D and Ca++ supplement.  Not exercising regularly.  No balance issue.  No recent fall.  In the process of getting a tooth implant in 01/2022. Bone density results thoroughly reviewed with patient.  Counseling on the risks associated with Osteoporosis discussed.  Treatment options reviewed.  Will continue on Vit D supplement, take Ca++ total 1.5 g/d and increase her weight bearing activities, especially daily walks.  Will start on Fosamax 70 mg/tab 1 tab PO weekly.  Usage reviewed.  Risks and benefits reviewed.  Given the risk of jaw necrosis, will not start Fosamax until her tooth implant is fully healed, at the earliest, will start in July 2023.  Prescription sent to pharmacy. ? ?2. At risk  for dental problems ?In the process of getting a Dental Implant.  The implant will be put in place in 01/2022.  Recommend not to start Fosamax until full healing post implant procedure, at the earliest in 03/2022. ? ?Other orders ?- alendronate (FOSAMAX) 70 MG tablet; Take 1 tablet (70 mg total) by mouth every 7 (seven) days. Take with a full glass of water on an empty stomach.  ? ?Genia Del MD, 10:50 AM 11/12/2021 ? ? ? ?  ?

## 2022-10-29 ENCOUNTER — Ambulatory Visit: Payer: Medicare Other | Admitting: Radiology

## 2023-01-14 ENCOUNTER — Other Ambulatory Visit: Payer: Self-pay | Admitting: Obstetrics & Gynecology

## 2023-01-14 NOTE — Telephone Encounter (Signed)
Last aex was 2/23. Rx denied for fosamax. No aex is scheduled. Pharmacy note placed for patient to call to schedule yearly exam & message sent to [facility] appointments to call patient to schedule her

## 2023-02-15 ENCOUNTER — Other Ambulatory Visit: Payer: Self-pay | Admitting: Student

## 2023-02-15 DIAGNOSIS — Z1231 Encounter for screening mammogram for malignant neoplasm of breast: Secondary | ICD-10-CM

## 2023-02-18 ENCOUNTER — Ambulatory Visit
Admission: RE | Admit: 2023-02-18 | Discharge: 2023-02-18 | Disposition: A | Discharge status: Home / Self Care | Payer: Medicare Other | Source: Ambulatory Visit | Attending: Student | Admitting: Student

## 2023-02-18 DIAGNOSIS — Z1231 Encounter for screening mammogram for malignant neoplasm of breast: Secondary | ICD-10-CM

## 2024-04-10 ENCOUNTER — Other Ambulatory Visit: Payer: Self-pay | Admitting: Student

## 2024-04-10 DIAGNOSIS — Z1231 Encounter for screening mammogram for malignant neoplasm of breast: Secondary | ICD-10-CM

## 2024-04-17 ENCOUNTER — Ambulatory Visit
Admission: RE | Admit: 2024-04-17 | Discharge: 2024-04-17 | Disposition: A | Discharge status: Home / Self Care | Source: Ambulatory Visit | Attending: Student | Admitting: Student

## 2024-04-17 DIAGNOSIS — Z1231 Encounter for screening mammogram for malignant neoplasm of breast: Secondary | ICD-10-CM
